# Patient Record
Sex: Female | Born: 1980 | Race: Black or African American | Hispanic: No | Marital: Single | Smoking: Current every day smoker
Health system: Southern US, Community
[De-identification: ages and names within clinical notes are randomized; demographics above are authoritative.]

## PROBLEM LIST (undated history)

## (undated) DIAGNOSIS — I10 Essential (primary) hypertension: Secondary | ICD-10-CM

## (undated) DIAGNOSIS — T7840XA Allergy, unspecified, initial encounter: Secondary | ICD-10-CM

## (undated) DIAGNOSIS — G600 Hereditary motor and sensory neuropathy: Secondary | ICD-10-CM

## (undated) HISTORY — DX: Allergy, unspecified, initial encounter: T78.40XA

## (undated) HISTORY — PX: OTHER SURGICAL HISTORY: SHX169

## (undated) HISTORY — DX: Hereditary motor and sensory neuropathy: G60.0

## (undated) HISTORY — PX: OVARIAN CYST REMOVAL: SHX89

---

## 2013-03-06 HISTORY — PX: MR LOWER LEG LEFT (ARMC HX): HXRAD1784

## 2014-03-10 ENCOUNTER — Encounter: Payer: Self-pay | Admitting: Neurology

## 2014-04-06 ENCOUNTER — Encounter: Payer: Self-pay | Admitting: Neurology

## 2014-05-05 ENCOUNTER — Encounter
Admit: 2014-05-05 | Disposition: A | Discharge status: Home / Self Care | Payer: Self-pay | Attending: Neurology | Admitting: Neurology

## 2014-05-18 ENCOUNTER — Emergency Department: Payer: Self-pay | Admitting: Emergency Medicine

## 2014-06-05 ENCOUNTER — Encounter
Admit: 2014-06-05 | Disposition: A | Discharge status: Home / Self Care | Payer: Self-pay | Attending: Neurology | Admitting: Neurology

## 2015-05-07 ENCOUNTER — Encounter: Payer: Self-pay | Admitting: Emergency Medicine

## 2015-05-07 ENCOUNTER — Emergency Department
Admission: EM | Admit: 2015-05-07 | Discharge: 2015-05-07 | Disposition: A | Discharge status: Home / Self Care | Payer: Medicare Other | Attending: Emergency Medicine | Admitting: Emergency Medicine

## 2015-05-07 DIAGNOSIS — Y9389 Activity, other specified: Secondary | ICD-10-CM | POA: Diagnosis not present

## 2015-05-07 DIAGNOSIS — S0502XA Injury of conjunctiva and corneal abrasion without foreign body, left eye, initial encounter: Secondary | ICD-10-CM | POA: Insufficient documentation

## 2015-05-07 DIAGNOSIS — F17200 Nicotine dependence, unspecified, uncomplicated: Secondary | ICD-10-CM | POA: Diagnosis not present

## 2015-05-07 DIAGNOSIS — I10 Essential (primary) hypertension: Secondary | ICD-10-CM | POA: Insufficient documentation

## 2015-05-07 DIAGNOSIS — S0592XA Unspecified injury of left eye and orbit, initial encounter: Secondary | ICD-10-CM | POA: Diagnosis present

## 2015-05-07 DIAGNOSIS — Y998 Other external cause status: Secondary | ICD-10-CM | POA: Diagnosis not present

## 2015-05-07 DIAGNOSIS — Y9289 Other specified places as the place of occurrence of the external cause: Secondary | ICD-10-CM | POA: Insufficient documentation

## 2015-05-07 DIAGNOSIS — X58XXXA Exposure to other specified factors, initial encounter: Secondary | ICD-10-CM | POA: Insufficient documentation

## 2015-05-07 HISTORY — DX: Essential (primary) hypertension: I10

## 2015-05-07 HISTORY — DX: Hereditary motor and sensory neuropathy: G60.0

## 2015-05-07 LAB — GLUCOSE, CAPILLARY: Glucose-Capillary: 118 mg/dL — ABNORMAL HIGH (ref 65–99)

## 2015-05-07 MED ORDER — ERYTHROMYCIN 5 MG/GM OP OINT: 1.0000 "application " | TOPICAL_OINTMENT | Freq: Three times a day (TID) | OPHTHALMIC | Status: DC

## 2015-05-07 MED ORDER — EYE WASH OPHTH SOLN
OPHTHALMIC | Status: AC
  Filled 2015-05-07: qty 118

## 2015-05-07 MED ORDER — FLUORESCEIN SODIUM 1 MG OP STRP
ORAL_STRIP | OPHTHALMIC | Status: AC
  Filled 2015-05-07: qty 1

## 2015-05-07 MED ORDER — TETRACAINE HCL 0.5 % OP SOLN
OPHTHALMIC | Status: AC
  Filled 2015-05-07: qty 2

## 2015-05-07 NOTE — ED Notes (Addendum)
Pt reports blurred vision at 1430 this afternoon. Pt denies dizziness, CP, SHOB, headache. Pt ambulatory to triage with no difficulty. Pt reports pain to left eye when opened.

## 2015-05-07 NOTE — ED Notes (Addendum)
Visual Acuity:  20/70 Left, 20/40 Right

## 2015-05-07 NOTE — ED Provider Notes (Signed)
[facility] Emergency Department Provider Note  ____________________________________________  Time seen: Approximately 5:03 PM  I have reviewed the triage vital signs and the nursing notes.   HISTORY  Chief Complaint Blurred Vision    HPI Kristine Campbell is a 35 y.o. female presents for evaluation of blurred vision at approximately 3 hours ago. 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 She also complains of pain to her left thigh worse than her right. Denies any known trauma or foreign body sensation. Visual acuities noted. Is not taking any medication over-the-counter and has significant allergies.   Past Medical History  Diagnosis Date  . Hypertension   . CMTD (Charcot-Marie-Tooth disease)     There are no active problems to display for this patient.   History reviewed. No pertinent past surgical history.  Current Outpatient Rx  Name  Route  Sig  Dispense  Refill  . erythromycin ophthalmic ointment   Left Eye   Place 1 application into the left eye 3 (three) times daily.   3.5 g   0     Allergies Other  No family history on file.  Social History Social History  Substance Use Topics  . Smoking status: Current Every Day Smoker  . Smokeless tobacco: None  . Alcohol Use: No    Review of Systems Constitutional: No fever/chills Eyes: Positive visual changes, left worse than right. ENT: No sore throat. Cardiovascular: Denies chest pain. Musculoskeletal: Negative for back pain. Skin: Negative for rash. Neurological: Negative for headaches, focal weakness or numbness.  10-point ROS otherwise negative.  ____________________________________________   PHYSICAL EXAM:  VITAL SIGNS: ED Triage Vitals  Enc Vitals Group     BP 05/07/15 1628 129/92 mmHg     Pulse Rate 05/07/15 1628 115     Resp 05/07/15 1628 16     Temp 05/07/15 1628 98.8 F (37.1 C)     Temp Source  05/07/15 1628 Oral     SpO2 05/07/15 1628 100 %     Weight 05/07/15 1628 156 lb (70.761 kg)     Height 05/07/15 1628 5\' 8"  (1.727 m)     Head Cir --      Peak Flow --      Pain Score 05/07/15 1628 8     Pain Loc --      Pain Edu? --      Excl. in GC? --     Constitutional: Alert and oriented. Well appearing and in no acute distress. Eyes: Right eye unremarkable left eye with erythematous conjunctiva. Head: Atraumatic. Nose: No congestion/rhinnorhea. Mouth/Throat: Mucous membranes are moist.  Oropharynx non-erythematous. Neck: No stridor.   Cardiovascular: Normal rate, regular rhythm. Grossly normal heart sounds.  Good peripheral circulation. Respiratory: Normal respiratory effort.  No retractions. Lungs CTAB. Neurologic:  Normal speech and language. No gross focal neurologic deficits are appreciated. No gait instability. Skin:  Skin is warm, dry and intact. No rash noted. Psychiatric: Mood and affect are normal. Speech and behavior are normal.  ____________________________________________   LABS (all labs ordered are listed, but only abnormal results are displayed)  Labs Reviewed  CBG MONITORING, ED      PROCEDURES  Procedure(s) performed:Yes  Discussed all clinical findings with the patient and discussed all allergies. Tetracaine 0.5% ophthalmic solution 2 drops placed in left eye. Fluorescein strip applied. Visualized left cornea with Woods lamp. Semilunate or corneal abrasion noted covering the superior medial aspect of the pupil. Approximately  15% of the eye. Irrigated left eye with 120 cc of eyewash irrigating solution.  Patient tolerated the procedure well.  INITIAL IMPRESSION / ASSESSMENT AND PLAN / ED COURSE  Pertinent labs & imaging results that were available during my care of the patient were reviewed by me and considered in my medical decision making (see chart for details).  Acute corneal abrasion to left eye. Rx given for erythromycin ointment 3 times a  day and encouraged use Tylenol over-the-counter for pain control. Patient to return to the ER 24 hours for eye recheck. She voices no other emergency medical complaints at this time. ____________________________________________   FINAL CLINICAL IMPRESSION(S) / ED DIAGNOSES  Final diagnoses:  Corneal abrasion, left, initial encounter     This chart was dictated using voice recognition software/Dragon. Despite best efforts to proofread, errors can occur which can change the meaning. Any change was purely unintentional.   Evangeline Dakin, PA-C 05/07/15 1738  Minna Antis, MD 05/07/15 [phone]

## 2015-05-07 NOTE — Discharge Instructions (Signed)

## 2015-05-08 ENCOUNTER — Emergency Department
Admission: EM | Admit: 2015-05-08 | Discharge: 2015-05-08 | Disposition: A | Discharge status: Home / Self Care | Payer: Medicare Other | Attending: Student | Admitting: Student

## 2015-05-08 ENCOUNTER — Encounter: Payer: Self-pay | Admitting: Emergency Medicine

## 2015-05-08 DIAGNOSIS — S0502XA Injury of conjunctiva and corneal abrasion without foreign body, left eye, initial encounter: Secondary | ICD-10-CM

## 2015-05-08 DIAGNOSIS — I10 Essential (primary) hypertension: Secondary | ICD-10-CM | POA: Insufficient documentation

## 2015-05-08 DIAGNOSIS — Y9289 Other specified places as the place of occurrence of the external cause: Secondary | ICD-10-CM | POA: Diagnosis not present

## 2015-05-08 DIAGNOSIS — Z4801 Encounter for change or removal of surgical wound dressing: Secondary | ICD-10-CM | POA: Diagnosis present

## 2015-05-08 DIAGNOSIS — Z792 Long term (current) use of antibiotics: Secondary | ICD-10-CM | POA: Diagnosis not present

## 2015-05-08 DIAGNOSIS — Y9389 Activity, other specified: Secondary | ICD-10-CM | POA: Insufficient documentation

## 2015-05-08 DIAGNOSIS — Y998 Other external cause status: Secondary | ICD-10-CM | POA: Diagnosis not present

## 2015-05-08 DIAGNOSIS — X58XXXA Exposure to other specified factors, initial encounter: Secondary | ICD-10-CM | POA: Diagnosis not present

## 2015-05-08 DIAGNOSIS — F17200 Nicotine dependence, unspecified, uncomplicated: Secondary | ICD-10-CM | POA: Insufficient documentation

## 2015-05-08 MED ORDER — FLUORESCEIN SODIUM 1 MG OP STRP
1.0000 | ORAL_STRIP | Freq: Once | OPHTHALMIC | Status: AC
  Administered 2015-05-08: 1 via OPHTHALMIC

## 2015-05-08 MED ORDER — FLUORESCEIN SODIUM 1 MG OP STRP
ORAL_STRIP | OPHTHALMIC | Status: AC
Start: 2015-05-08 — End: 2015-05-08
  Administered 2015-05-08: 1 via OPHTHALMIC
  Filled 2015-05-08: qty 1

## 2015-05-08 MED ORDER — ONDANSETRON HCL 4 MG/2ML IJ SOLN: 4.0000 mg | Freq: Once | INTRAMUSCULAR | Status: DC

## 2015-05-08 MED ORDER — HYDROMORPHONE HCL 1 MG/ML IJ SOLN: 1.0000 mg | Freq: Once | INTRAMUSCULAR | Status: DC

## 2015-05-08 MED ORDER — EYE WASH OPHTH SOLN
OPHTHALMIC | Status: AC
  Administered 2015-05-08: 1 [drp] via OPHTHALMIC
  Filled 2015-05-08: qty 118

## 2015-05-08 MED ORDER — EYE WASH OPHTH SOLN
1.0000 [drp] | OPHTHALMIC | Status: DC | PRN
  Administered 2015-05-08: 1 [drp] via OPHTHALMIC

## 2015-05-08 NOTE — ED Notes (Signed)
Visual acuity 20/70 bil.

## 2015-05-08 NOTE — Discharge Instructions (Signed)

## 2015-05-08 NOTE — ED Notes (Signed)
Discussed discharge instructions and follow-up care with patient. No questions or concerns at this time. Pt stable at discharge.  

## 2015-05-08 NOTE — ED Notes (Signed)
States was here for corneal abrasion yesterday, states instructed to come today for recheck.

## 2015-05-08 NOTE — ED Provider Notes (Signed)
[facility] Emergency Department Provider Note  ____________________________________________  Time seen: Approximately 9:25 AM  I have reviewed the triage vital signs and the nursing notes.   HISTORY  Chief Complaint Wound Check    HPI Kristine Campbell is a 35 y.o. female who presents for follow-up evaluation of corneal abrasion to the left eye previously. Patient states that she did not get her antibiotic but her pain feels better today. Patient's complains that she still can't see out of her left eye.    Past Medical History  Diagnosis Date  . Hypertension   . CMTD (Charcot-Marie-Tooth disease)     There are no active problems to display for this patient.   History reviewed. No pertinent past surgical history.  Current Outpatient Rx  Name  Route  Sig  Dispense  Refill  . erythromycin ophthalmic ointment   Left Eye   Place 1 application into the left eye 3 (three) times daily.   3.5 g   0     Allergies Other  No family history on file.  Social History Social History  Substance Use Topics  . Smoking status: Current Every Day Smoker  . Smokeless tobacco: None  . Alcohol Use: No    Review of Systems Constitutional: No fever/chills Eyes: Positive visual changes with erythematous to the conjunctiva Neurological: Negative for headaches, focal weakness or numbness.  10-point ROS otherwise negative.  ____________________________________________   PHYSICAL EXAM:  VITAL SIGNS: ED Triage Vitals  Enc Vitals Group     BP 05/08/15 0919 115/81 mmHg     Pulse Rate 05/08/15 0919 100     Resp 05/08/15 0919 18     Temp 05/08/15 0919 98.2 F (36.8 C)     Temp Source 05/08/15 0919 Oral     SpO2 05/08/15 0919 99 %     Weight 05/08/15 0919 156 lb (70.761 kg)     Height 05/08/15 0919 5\' 8"  (1.727 m)     Head Cir --      Peak Flow --      Pain Score 05/08/15 0922 5     Pain Loc --      Pain Edu? --      Excl. in GC? --      Constitutional: Alert and oriented. Well appearing and in no acute distress. Eyes: Left eye with conjunctival erythema and positive corneal abrasion noted over the pupil extending approximately 30% of the eye. Visual acuities noted same as yesterday at 70 over 70. Neurologic:  Normal speech and language. No gross focal neurologic deficits are appreciated. No gait instability. Skin:  Skin is warm, dry and intact. No rash noted. Psychiatric: Mood and affect are normal. Speech and behavior are normal.  ____________________________________________   LABS (all labs ordered are listed, but only abnormal results are displayed)  Labs Reviewed - No data to display   PROCEDURES  Procedure(s) performed: None  Critical Care performed: No  ____________________________________________   INITIAL IMPRESSION / ASSESSMENT AND PLAN / ED COURSE  Pertinent labs & imaging results that were available during my care of the patient were reviewed by me and considered in my medical decision making (see chart for details).  Corneal abrasion left eye continuous. Patient states that she will get her antibiotic ointment filled today and will follow-up in ED in 48 hours if needed [place]. She reports that any sudden onset or visual loss she will go to the nearest  emergency room otherwise needs follow-up with ophthalmology. ____________________________________________   FINAL CLINICAL IMPRESSION(S) / ED DIAGNOSES  Final diagnoses:  Corneal abrasion, left, initial encounter     This chart was dictated using voice recognition software/Dragon. Despite best efforts to proofread, errors can occur which can change the meaning. Any change was purely unintentional.   Evangeline Dakin, PA-C 05/08/15 1024  Charmayne Sheer Beers, PA-C 05/08/15 1024  Gayla Doss, MD 05/08/15 [phone]

## 2016-02-17 ENCOUNTER — Emergency Department
Admission: EM | Admit: 2016-02-17 | Discharge: 2016-02-17 | Disposition: A | Discharge status: Home / Self Care | Payer: Medicare Other | Attending: Emergency Medicine | Admitting: Emergency Medicine

## 2016-02-17 ENCOUNTER — Encounter: Payer: Self-pay | Admitting: Emergency Medicine

## 2016-02-17 DIAGNOSIS — R102 Pelvic and perineal pain: Secondary | ICD-10-CM

## 2016-02-17 DIAGNOSIS — R1030 Lower abdominal pain, unspecified: Secondary | ICD-10-CM | POA: Diagnosis not present

## 2016-02-17 DIAGNOSIS — F17290 Nicotine dependence, other tobacco product, uncomplicated: Secondary | ICD-10-CM | POA: Insufficient documentation

## 2016-02-17 DIAGNOSIS — I10 Essential (primary) hypertension: Secondary | ICD-10-CM | POA: Insufficient documentation

## 2016-02-17 LAB — COMPREHENSIVE METABOLIC PANEL
ALBUMIN: 4 g/dL (ref 3.5–5.0)
ALK PHOS: 83 U/L (ref 38–126)
ALT: 18 U/L (ref 14–54)
ANION GAP: 9 (ref 5–15)
AST: 31 U/L (ref 15–41)
BILIRUBIN TOTAL: 0.4 mg/dL (ref 0.3–1.2)
BUN: 8 mg/dL (ref 6–20)
CALCIUM: 9.3 mg/dL (ref 8.9–10.3)
CO2: 25 mmol/L (ref 22–32)
Chloride: 102 mmol/L (ref 101–111)
Creatinine, Ser: 0.39 mg/dL — ABNORMAL LOW (ref 0.44–1.00)
GFR calc Af Amer: >60 mL/min (ref >60)
GFR calc non Af Amer: >60 mL/min (ref >60)
GLUCOSE: 107 mg/dL — AB (ref 65–99)
Potassium: 3.7 mmol/L (ref 3.5–5.1)
SODIUM: 136 mmol/L (ref 135–145)
TOTAL PROTEIN: 8.2 g/dL — AB (ref 6.5–8.1)

## 2016-02-17 LAB — CBC
HEMATOCRIT: 39.4 % (ref 35.0–47.0)
HEMOGLOBIN: 13.5 g/dL (ref 12.0–16.0)
MCH: 27.8 pg (ref 26.0–34.0)
MCHC: 34.2 g/dL (ref 32.0–36.0)
MCV: 81.4 fL (ref 80.0–100.0)
Platelets: 267 10*3/uL (ref 150–440)
RBC: 4.85 MIL/uL (ref 3.80–5.20)
RDW: 16.5 % — AB (ref 11.5–14.5)
WBC: 10.2 10*3/uL (ref 3.6–11.0)

## 2016-02-17 LAB — URINALYSIS, COMPLETE (UACMP) WITH MICROSCOPIC
BACTERIA UA: NONE SEEN
Bilirubin Urine: NEGATIVE
Glucose, UA: NEGATIVE mg/dL
Ketones, ur: NEGATIVE mg/dL
Leukocytes, UA: NEGATIVE
NITRITE: NEGATIVE
PH: 6 (ref 5.0–8.0)
Protein, ur: NEGATIVE mg/dL
SPECIFIC GRAVITY, URINE: 1.006 (ref 1.005–1.030)

## 2016-02-17 LAB — POCT PREGNANCY, URINE: PREG TEST UR: NEGATIVE

## 2016-02-17 LAB — LIPASE, BLOOD: Lipase: 21 U/L (ref 11–51)

## 2016-02-17 NOTE — ED Provider Notes (Signed)
[facility] Emergency Department Provider Note  ____________________________________________  Time seen: Approximately 9:09 PM  I have reviewed the triage vital signs and the nursing notes.   HISTORY  Chief Complaint Abdominal Pain    HPI Kristine Campbell is a 35 y.o. female , nonpregnant, presenting with suprapubic discomfort. The patient reports that for the past 4 days she has had her period, and she has had an unusual sensation in the suprapubic area. It's a very mild pain, and she is not able to describe it any further. She does not have any associated change in vaginal discharge, nausea or vomiting, diarrhea, fever or chills. Her last bowel movement was this morning and it was normal. She is not having any dysuria.   Past Medical History:  Diagnosis Date  . CMTD (Charcot-Marie-Tooth disease)   . Hypertension     There are no active problems to display for this patient.   History reviewed. No pertinent surgical history.  Current Outpatient Rx  . Order #: [id number] Class: Print    Allergies Other  History reviewed. No pertinent family history.  Social History Social History  Substance Use Topics  . Smoking status: Current Every Day Smoker    Types: Cigars  . Smokeless tobacco: Never Used  . Alcohol use No    Review of Systems Constitutional: No fever/chills.No lightheadedness or syncope. Eyes: No visual changes. ENT: No sore throat. No congestion or rhinorrhea. Cardiovascular: Denies chest pain. Denies palpitations. Respiratory: Denies shortness of breath.  No cough. Gastrointestinal: Positive suprapubic discomfort.  No nausea, no vomiting.  No diarrhea.  No constipation. Genitourinary: Negative for dysuria. No change in vaginal discharge. Ongoing menses. Musculoskeletal: Negative for back pain. Skin: Negative for rash. Neurological: Negative for headaches. No focal numbness, tingling or weakness.   10-point ROS otherwise  negative.  ____________________________________________   PHYSICAL EXAM:  VITAL SIGNS: ED Triage Vitals  Enc Vitals Group     BP 02/17/16 1716 124/84     Pulse Rate 02/17/16 1716 (!) 113     Resp 02/17/16 1716 16     Temp 02/17/16 1716 99.5 F (37.5 C)     Temp Source 02/17/16 1716 Oral     SpO2 02/17/16 1716 99 %     Weight 02/17/16 1717 156 lb (70.8 kg)     Height 02/17/16 1717 5\' 8"  (1.727 m)     Head Circumference --      Peak Flow --      Pain Score 02/17/16 1721 3     Pain Loc --      Pain Edu? --      Excl. in GC? --     Constitutional: Alert and oriented. Well appearing and in no acute distress. Answers questions appropriately. Eyes: Conjunctivae are normal.  EOMI. No scleral icterus. Head: Atraumatic. Nose: No congestion/rhinnorhea. Mouth/Throat: Mucous membranes are moist.  Neck: No stridor.  Supple.   Cardiovascular: Normal rate, regular rhythm. No murmurs, rubs or gallops.  Respiratory: Normal respiratory effort.  No accessory muscle use or retractions. Lungs CTAB.  No wheezes, rales or ronchi. Gastrointestinal: Soft, and nondistended. Minimal discomfort with palpation in the suprapubic midline. No guarding or rebound.  No peritoneal signs. Musculoskeletal: No LE edema. Neurologic:  A&Ox3.  Speech is clear.  Face and smile are symmetric.  EOMI.  Moves all extremities well. Skin:  Skin is warm, dry and intact. No rash noted. Psychiatric: Mood and affect are normal. Speech and behavior are normal.  Normal judgement.  ____________________________________________  LABS (all labs ordered are listed, but only abnormal results are displayed)  Labs Reviewed  COMPREHENSIVE METABOLIC PANEL - Abnormal; Notable for the following:       Result Value   Glucose, Bld 107 (*)    Creatinine, Ser 0.39 (*)    Total Protein 8.2 (*)    All other components within normal limits  CBC - Abnormal; Notable for the following:    RDW 16.5 (*)    All other components within  normal limits  URINALYSIS, COMPLETE (UACMP) WITH MICROSCOPIC - Abnormal; Notable for the following:    Color, Urine STRAW (*)    APPearance CLEAR (*)    Hgb urine dipstick MODERATE (*)    Squamous Epithelial / LPF 0-5 (*)    All other components within normal limits  LIPASE, BLOOD  POC URINE PREG, ED  POCT PREGNANCY, URINE   ____________________________________________  EKG  Not indicated ____________________________________________  RADIOLOGY  No results found.  ____________________________________________   PROCEDURES  Procedure(s) performed: None  Procedures  Critical Care performed: No ____________________________________________   INITIAL IMPRESSION / ASSESSMENT AND PLAN / ED COURSE  Pertinent labs & imaging results that were available during my care of the patient were reviewed by me and considered in my medical decision making (see chart for details).  35 y.o. female presenting with suprapubic discomfort in the setting of menstruation. Overall, the patient has reassuring vital signs and has normal heart rate on my examination. She is afebrile, and her laboratory studies show normal white blood cell count, normal lites, and a urine that does not show UTI. Is possible that the patient has pain from her menstruation, or she may have fibroids. At this time, her workup does not indicate further imaging but I have spoken to her about following up with her primary care physician for ongoing pain and possible ultrasound versus CT scan if her pain continues. Change since return precautions as well as follow-up instructions.  ____________________________________________  FINAL CLINICAL IMPRESSION(S) / ED DIAGNOSES  Final diagnoses:  Suprapubic pain, acute    Clinical Course       NEW MEDICATIONS STARTED DURING THIS VISIT:  New Prescriptions   No medications on file      Rockne MenghiniAnne-Caroline Skie Vitrano, MD 02/17/16 2112

## 2016-02-17 NOTE — Discharge Instructions (Signed)
History and plenty of fluid to stay well-hydrated. You may try a heating pad for 10 minutes every 2 hours to decrease pain. Please return to the emergency department if your pain worsens, or if you develop fever, nausea or vomiting, diarrhea, or any other symptoms concerning to you.

## 2016-02-17 NOTE — ED Triage Notes (Signed)
Pt to ED c/o LLQ pain x3 days, denies n/v/d.  States feels tight, states took home pregnancy test and was negative.  Pt A&O, skin warm and dry.

## 2016-03-02 ENCOUNTER — Other Ambulatory Visit: Payer: Self-pay | Admitting: Family Medicine

## 2016-03-02 DIAGNOSIS — R1900 Intra-abdominal and pelvic swelling, mass and lump, unspecified site: Secondary | ICD-10-CM

## 2016-03-07 ENCOUNTER — Ambulatory Visit
Admission: RE | Admit: 2016-03-07 | Discharge: 2016-03-07 | Disposition: A | Discharge status: Home / Self Care | Payer: Medicare Other | Source: Ambulatory Visit | Attending: Family Medicine | Admitting: Family Medicine

## 2016-03-07 DIAGNOSIS — R1900 Intra-abdominal and pelvic swelling, mass and lump, unspecified site: Secondary | ICD-10-CM | POA: Diagnosis not present

## 2016-03-07 DIAGNOSIS — R109 Unspecified abdominal pain: Secondary | ICD-10-CM | POA: Insufficient documentation

## 2016-03-07 DIAGNOSIS — N83201 Unspecified ovarian cyst, right side: Secondary | ICD-10-CM | POA: Insufficient documentation

## 2016-11-09 ENCOUNTER — Emergency Department: Payer: Medicare Other

## 2016-11-09 ENCOUNTER — Emergency Department
Admission: EM | Admit: 2016-11-09 | Discharge: 2016-11-10 | Disposition: A | Discharge status: Home / Self Care | Payer: Medicare Other | Attending: Emergency Medicine | Admitting: Emergency Medicine

## 2016-11-09 ENCOUNTER — Encounter: Payer: Self-pay | Admitting: Emergency Medicine

## 2016-11-09 DIAGNOSIS — M25561 Pain in right knee: Secondary | ICD-10-CM | POA: Insufficient documentation

## 2016-11-09 DIAGNOSIS — I10 Essential (primary) hypertension: Secondary | ICD-10-CM | POA: Diagnosis not present

## 2016-11-09 DIAGNOSIS — F17210 Nicotine dependence, cigarettes, uncomplicated: Secondary | ICD-10-CM | POA: Diagnosis not present

## 2016-11-09 NOTE — ED Triage Notes (Signed)
Pt to triage via w/c with no distress noted, reports "didn't have shoe on right" and fell twisting right knee; c/o pain since

## 2016-11-09 NOTE — ED Notes (Signed)
Pt returned from xray via wheelchair

## 2016-11-10 NOTE — ED Notes (Signed)

## 2016-11-10 NOTE — ED Provider Notes (Signed)
[facility]-EMERGENCY DEPARTMENT Provider Note   CSN: [id number] Arrival date & time: 11/09/16  2152     History   Chief Complaint Chief Complaint  Patient presents with  . Knee Pain    HPI Kristine Campbell is a 36 y.o. female presents to the emergent department for evaluation of right knee pain. Patient states just prior to arrival she was putting on a shoe, stumbled, twisted and felt pain in her right knee. She developed some pain and swelling. Pain is moderate. She has not taken any medications for pain. Pain is improved with lying down. Pain is increased with flexing the knee and weightbearing activity. She has been able to ambulate but with at length. She denies any injury to her body. No groin, back or ankle pain.  HPI  Past Medical History:  Diagnosis Date  . CMTD (Charcot-Marie-Tooth disease)   . Hypertension     There are no active problems to display for this patient.   History reviewed. No pertinent surgical history.  OB History    No data available       Home Medications    Prior to Admission medications   Medication Sig Start Date End Date Taking? Authorizing Provider  erythromycin ophthalmic ointment Place 1 application into the left eye 3 (three) times daily. 05/07/15   Beers, Charmayne Sheer, PA-C    Family History No family history on file.  Social History Social History  Substance Use Topics  . Smoking status: Current Every Day Smoker    Types: Cigars  . Smokeless tobacco: Never Used  . Alcohol use No     Allergies   Other   Review of Systems Review of Systems  Constitutional: Negative for fever.  Respiratory: Negative for shortness of breath.   Cardiovascular: Negative for chest pain.  Musculoskeletal: Positive for arthralgias and joint swelling. Negative for back pain, gait problem, myalgias, neck pain and neck stiffness.  Skin: Negative for wound.  Neurological: Negative for dizziness, weakness, light-headedness and headaches.      Physical Exam Updated Vital Signs BP 119/85 (BP Location: Left Arm)   Pulse (!) 105   Temp 98.7 F (37.1 C) (Oral)   Resp 18   Ht  (1.727 m)   Wt 69.9 kg (154 lb)   LMP 10/23/2016   SpO2 100%   BMI 23.42 kg/m   Physical Exam  Constitutional: She is oriented to person, place, and time. She appears well-developed and well-nourished.  HENT:  Head: Normocephalic and atraumatic.  Neck: Normal range of motion.  Pulmonary/Chest: Effort normal. No respiratory distress.  Musculoskeletal:  Examination of the right knee shows patient has mild swelling, no effusion mild tenderness on the medial lateral joint line. She is able to straight leg raise area shows good internal and external rotation of the right hip with no discomfort. She has range of motion to 100 with moderate discomfort. She is stable to valgus and varus stress testing. Positive McMurray's sign. Negative anterior drawer test.  Neurological: She is alert and oriented to person, place, and time.     ED Treatments / Results  Labs (all labs ordered are listed, but only abnormal results are displayed) Labs Reviewed - No data to display  EKG  EKG Interpretation None       Radiology Dg Knee Complete 4 Views Right  Result Date: 11/09/2016 CLINICAL DATA:  Fall with twisting injury to the right knee. Right knee pain. EXAM: RIGHT KNEE - COMPLETE 4+ VIEW COMPARISON:  None. FINDINGS: No  evidence of fracture, dislocation, or joint effusion. No evidence of arthropathy or other focal bone abnormality. Soft tissues are unremarkable. IMPRESSION: Negative. Electronically Signed   By: Burman NievesWilliam  Stevens M.D.   On: 11/09/2016 22:54    Procedures Procedures (including critical care time) SPLINT APPLICATION Date/Time: 12:41 AM Authorized by: Patience MuscaGAINES, Aidric Endicott CHRISTOPHER Consent: Verbal consent obtained. Risks and benefits: risks, benefits and alternatives were discussed Consent given by: patient Splint applied by: ED  tech Location details: Right knee  Splint type: Knee immobilizer right  Supplies used: Knee immobilizer, walker  Post-procedure: The splinted body part was neurovascularly unchanged following the procedure. Patient tolerance: Patient tolerated the procedure well with no immediate complications.     Medications Ordered in ED Medications - No data to display   Initial Impression / Assessment and Plan / ED Course  I have reviewed the triage vital signs and the nursing notes.  Pertinent labs & imaging results that were available during my care of the patient were reviewed by me and considered in my medical decision making (see chart for details).     36 year old female with right knee pain. Patient suffered a twisting injury to the right knee. X-ray show no evidence of acute bony abnormality. No injury to the extensor mechanism. No ligament laxity. No significant effusion. She is placed in knee immobilizer. She is given a walker to help with ambulation. She will rest ice and elevate the knee. Follow-up with orthopedics.  Final Clinical Impressions(s) / ED Diagnoses   Final diagnoses:  Acute pain of right knee    New Prescriptions New Prescriptions   No medications on file     Ronnette JuniperGaines, Chrishonda Hesch C, PA-C 11/10/16 0042    Nita SickleVeronese, [place], MD 11/10/16 [phone]

## 2016-11-10 NOTE — Discharge Instructions (Signed)
Please rest ice and elevate the right knee. Wear knee immobilizer as needed. Use walker as needed for ambulation. Call orthopedics tomorrow to schedule follow-up appointment.

## 2018-03-07 DIAGNOSIS — J309 Allergic rhinitis, unspecified: Secondary | ICD-10-CM | POA: Diagnosis not present

## 2018-03-07 DIAGNOSIS — R718 Other abnormality of red blood cells: Secondary | ICD-10-CM | POA: Diagnosis not present

## 2018-04-05 DIAGNOSIS — L700 Acne vulgaris: Secondary | ICD-10-CM | POA: Diagnosis not present

## 2018-04-05 DIAGNOSIS — L905 Scar conditions and fibrosis of skin: Secondary | ICD-10-CM | POA: Diagnosis not present

## 2018-04-05 DIAGNOSIS — L2089 Other atopic dermatitis: Secondary | ICD-10-CM | POA: Diagnosis not present

## 2018-04-12 ENCOUNTER — Emergency Department: Payer: Medicare HMO

## 2018-04-12 ENCOUNTER — Emergency Department
Admission: EM | Admit: 2018-04-12 | Discharge: 2018-04-12 | Disposition: A | Discharge status: Home / Self Care | Payer: Medicare HMO | Attending: Emergency Medicine | Admitting: Emergency Medicine

## 2018-04-12 ENCOUNTER — Other Ambulatory Visit: Payer: Self-pay

## 2018-04-12 DIAGNOSIS — W19XXXA Unspecified fall, initial encounter: Secondary | ICD-10-CM | POA: Insufficient documentation

## 2018-04-12 DIAGNOSIS — S92251A Displaced fracture of navicular [scaphoid] of right foot, initial encounter for closed fracture: Secondary | ICD-10-CM | POA: Insufficient documentation

## 2018-04-12 DIAGNOSIS — S92241A Displaced fracture of medial cuneiform of right foot, initial encounter for closed fracture: Secondary | ICD-10-CM | POA: Diagnosis not present

## 2018-04-12 DIAGNOSIS — Y999 Unspecified external cause status: Secondary | ICD-10-CM | POA: Insufficient documentation

## 2018-04-12 DIAGNOSIS — F17290 Nicotine dependence, other tobacco product, uncomplicated: Secondary | ICD-10-CM | POA: Insufficient documentation

## 2018-04-12 DIAGNOSIS — Y9379 Activity, other specified sports and athletics: Secondary | ICD-10-CM | POA: Insufficient documentation

## 2018-04-12 DIAGNOSIS — I10 Essential (primary) hypertension: Secondary | ICD-10-CM | POA: Insufficient documentation

## 2018-04-12 DIAGNOSIS — Y929 Unspecified place or not applicable: Secondary | ICD-10-CM | POA: Diagnosis not present

## 2018-04-12 DIAGNOSIS — S99921A Unspecified injury of right foot, initial encounter: Secondary | ICD-10-CM | POA: Diagnosis present

## 2018-04-12 MED ORDER — MELOXICAM 15 MG PO TABS: 15.0000 mg | ORAL_TABLET | Freq: Every day | ORAL | 0 refills | Status: DC

## 2018-04-12 MED ORDER — HYDROCODONE-ACETAMINOPHEN 5-325 MG PO TABS: 1.0000 | ORAL_TABLET | Freq: Four times a day (QID) | ORAL | 0 refills | Status: AC | PRN

## 2018-04-12 MED ORDER — NAPROXEN 500 MG PO TABS
500.0000 mg | ORAL_TABLET | Freq: Once | ORAL | Status: AC
  Administered 2018-04-12: 500 mg via ORAL
  Filled 2018-04-12: qty 1

## 2018-04-12 MED ORDER — HYDROCODONE-ACETAMINOPHEN 5-325 MG PO TABS
1.0000 | ORAL_TABLET | Freq: Once | ORAL | Status: AC
  Administered 2018-04-12: 1 via ORAL
  Filled 2018-04-12: qty 1

## 2018-04-12 NOTE — ED Notes (Signed)
Patient transported to X-ray 

## 2018-04-12 NOTE — ED Triage Notes (Signed)
Pt states she fell at 1300 today injuring right foot. Ice applied in triage. tp complains of ankle pain as well, no swelling or deformity noted.

## 2018-04-12 NOTE — ED Provider Notes (Signed)
[facility] Emergency Department Provider Note ____________________________________________  Time seen: Approximately 9:34 PM  I have reviewed the triage vital signs and the nursing notes.   HISTORY  Chief Complaint Foot Injury    HPI Jamesetta Orleansnnette Garfinkel is a 38 y.o. female who presents to the emergency department for evaluation and treatment of right foot pain.  Patient states that she was trying to do some new exercises and fell.  She states that she took half an oxycodone which has helped, but the foot has continued to swell.  Incident happened this afternoon.  No previous fractures or injuries to the foot that she can recall.  Past Medical History:  Diagnosis Date  . CMTD (Charcot-Marie-Tooth disease)   . Hypertension     There are no active problems to display for this patient.   No past surgical history on file.  Prior to Admission medications   Medication Sig Start Date End Date Taking? Authorizing Provider  erythromycin ophthalmic ointment Place 1 application into the left eye 3 (three) times daily. 05/07/15   Beers, Charmayne Sheerharles M, PA-C  HYDROcodone-acetaminophen (NORCO/VICODIN) 5-325 MG tablet Take 1 tablet by mouth every 6 (six) hours as needed for up to 3 days for severe pain. 04/12/18 04/15/18  Junko Ohagan, Rulon Eisenmengerari B, FNP  meloxicam (MOBIC) 15 MG tablet Take 1 tablet (15 mg total) by mouth daily. 04/12/18   Chinita Pesterriplett, Tityana Pagan B, FNP    Allergies Other  No family history on file.  Social History Social History   Tobacco Use  . Smoking status: Current Every Day Smoker    Types: Cigars  . Smokeless tobacco: Never Used  Substance Use Topics  . Alcohol use: No  . Drug use: No    Review of Systems Constitutional: Negative for fever. Cardiovascular: Negative for chest pain. Respiratory: Negative for shortness of breath. Musculoskeletal: Positive for right foot and ankle pain. Skin: Positive for swelling of the right foot Neurological: Negative for  decrease in sensation  ____________________________________________   PHYSICAL EXAM:  VITAL SIGNS: ED Triage Vitals  Enc Vitals Group     BP 04/12/18 2031 124/84     Pulse Rate 04/12/18 2031 98     Resp 04/12/18 2031 16     Temp 04/12/18 2031 98.2 F (36.8 C)     Temp Source 04/12/18 2031 Oral     SpO2 04/12/18 2031 100 %     Weight 04/12/18 2030 150 lb (68 kg)     Height 04/12/18 2030 5\' 8"  (1.727 m)     Head Circumference --      Peak Flow --      Pain Score 04/12/18 2030 10     Pain Loc --      Pain Edu? --      Excl. in GC? --     Constitutional: Alert and oriented. Well appearing and in no acute distress. Eyes: Conjunctivae are clear without discharge or drainage Head: Atraumatic Neck: Supple Respiratory: No cough. Respirations are even and unlabored. Musculoskeletal: Tenderness over the midfoot on palpation.  Full range of motion of the toes.  Full range of motion of the ankle. Neurologic: Motor and sensory function of the right foot is intact. Skin: No open wounds or lesions over the midfoot.  Psychiatric: Flat affect, behavior is appropriate.  ____________________________________________   LABS (all labs ordered are listed, but only abnormal results are displayed)  Labs Reviewed - No data to display ____________________________________________  RADIOLOGY  Right ankle is negative for acute bony abnormalities.  Right foot shows a fracture along the anterior aspect of the navicular bone with a small avulsed fragment off of the anterior aspect of the medial cuneiform with some mild joint space narrowing in the first MTP joint. ____________________________________________   PROCEDURES  .Splint Application Date/Time: 04/12/2018 10:04 PM Performed by: Chinita Pester, FNP Authorized by: Chinita Pester, FNP   Consent:    Consent obtained:  Verbal   Consent given by:  Patient   Risks discussed:  Pain Procedure details:    Laterality:  Right   Cast type:   Short leg   Supplies:  Cotton padding, elastic bandage and Ortho-Glass Post-procedure details:    Pain:  Unchanged   Sensation:  Normal   Patient tolerance of procedure:  Tolerated well, no immediate complications   ____________________________________________   INITIAL IMPRESSION / ASSESSMENT AND PLAN / ED COURSE  Nyeli Diosdado is a 38 y.o. who presents to the emergency department for treatment and evaluation after injuring her foot earlier this afternoon.  X-ray confirms fracture of the midfoot.  She will be placed in a posterior short leg OCL and was advised to limit weightbearing as much as possible.  She will be given crutches.  She is to call podiatry on Monday morning to schedule an appointment.  She was given a prescription for meloxicam and Norco for the next few days.  She was encouraged to return to the emergency department for symptoms of change or worsen if she is unable to see her primary care provider or the podiatrist.   Medications  HYDROcodone-acetaminophen (NORCO/VICODIN) 5-325 MG per tablet 1 tablet (1 tablet Oral Given 04/12/18 2153)  naproxen (NAPROSYN) tablet 500 mg (500 mg Oral Given 04/12/18 2153)    Pertinent labs & imaging results that were available during my care of the patient were reviewed by me and considered in my medical decision making (see chart for details).  _________________________________________   FINAL CLINICAL IMPRESSION(S) / ED DIAGNOSES  Final diagnoses:  Closed displaced fracture of navicular bone of right foot, initial encounter  Closed displaced fracture of medial cuneiform of right foot, initial encounter    ED Discharge Orders         Ordered    HYDROcodone-acetaminophen (NORCO/VICODIN) 5-325 MG tablet  Every 6 hours PRN     04/12/18 2200    meloxicam (MOBIC) 15 MG tablet  Daily     04/12/18 2200           If controlled substance prescribed during this visit, 12 month history viewed on the [facility] prior to issuing an  initial prescription for Schedule II or III opiod.    Chinita Pester, FNP 04/12/18 2206    Rockne Menghini, MD 04/12/18 [phone]

## 2018-04-12 NOTE — Discharge Instructions (Signed)
Please call and schedule follow-up appointment with podiatry.  Rest, ice, and elevate your foot.  Take the pain medication only as prescribed.  Return to the emergency department for symptoms of change or worsen if you are unable to schedule an appointment.

## 2018-04-18 DIAGNOSIS — S92254A Nondisplaced fracture of navicular [scaphoid] of right foot, initial encounter for closed fracture: Secondary | ICD-10-CM | POA: Diagnosis not present

## 2018-04-23 DIAGNOSIS — J3089 Other allergic rhinitis: Secondary | ICD-10-CM | POA: Diagnosis not present

## 2018-04-30 DIAGNOSIS — J3089 Other allergic rhinitis: Secondary | ICD-10-CM | POA: Diagnosis not present

## 2018-05-02 DIAGNOSIS — S92254D Nondisplaced fracture of navicular [scaphoid] of right foot, subsequent encounter for fracture with routine healing: Secondary | ICD-10-CM | POA: Diagnosis not present

## 2018-05-02 DIAGNOSIS — J3089 Other allergic rhinitis: Secondary | ICD-10-CM | POA: Diagnosis not present

## 2018-05-07 DIAGNOSIS — J3089 Other allergic rhinitis: Secondary | ICD-10-CM | POA: Diagnosis not present

## 2018-05-09 DIAGNOSIS — J3089 Other allergic rhinitis: Secondary | ICD-10-CM | POA: Diagnosis not present

## 2018-05-16 DIAGNOSIS — J3089 Other allergic rhinitis: Secondary | ICD-10-CM | POA: Diagnosis not present

## 2018-05-23 DIAGNOSIS — S92254D Nondisplaced fracture of navicular [scaphoid] of right foot, subsequent encounter for fracture with routine healing: Secondary | ICD-10-CM | POA: Diagnosis not present

## 2018-05-23 DIAGNOSIS — J3089 Other allergic rhinitis: Secondary | ICD-10-CM | POA: Diagnosis not present

## 2018-05-28 DIAGNOSIS — J3089 Other allergic rhinitis: Secondary | ICD-10-CM | POA: Diagnosis not present

## 2018-06-06 DIAGNOSIS — J3089 Other allergic rhinitis: Secondary | ICD-10-CM | POA: Diagnosis not present

## 2018-06-13 DIAGNOSIS — J301 Allergic rhinitis due to pollen: Secondary | ICD-10-CM | POA: Diagnosis not present

## 2018-06-13 DIAGNOSIS — J3089 Other allergic rhinitis: Secondary | ICD-10-CM | POA: Diagnosis not present

## 2018-06-20 DIAGNOSIS — R21 Rash and other nonspecific skin eruption: Secondary | ICD-10-CM | POA: Diagnosis not present

## 2018-06-20 DIAGNOSIS — F17200 Nicotine dependence, unspecified, uncomplicated: Secondary | ICD-10-CM | POA: Diagnosis not present

## 2018-06-20 DIAGNOSIS — J3089 Other allergic rhinitis: Secondary | ICD-10-CM | POA: Diagnosis not present

## 2018-06-20 DIAGNOSIS — H1045 Other chronic allergic conjunctivitis: Secondary | ICD-10-CM | POA: Diagnosis not present

## 2018-06-24 DIAGNOSIS — J3089 Other allergic rhinitis: Secondary | ICD-10-CM | POA: Diagnosis not present

## 2018-06-27 DIAGNOSIS — J3089 Other allergic rhinitis: Secondary | ICD-10-CM | POA: Diagnosis not present

## 2018-07-04 DIAGNOSIS — J3089 Other allergic rhinitis: Secondary | ICD-10-CM | POA: Diagnosis not present

## 2018-07-11 DIAGNOSIS — J3089 Other allergic rhinitis: Secondary | ICD-10-CM | POA: Diagnosis not present

## 2018-07-18 DIAGNOSIS — J3089 Other allergic rhinitis: Secondary | ICD-10-CM | POA: Diagnosis not present

## 2018-07-23 DIAGNOSIS — J3089 Other allergic rhinitis: Secondary | ICD-10-CM | POA: Diagnosis not present

## 2018-07-25 DIAGNOSIS — J3089 Other allergic rhinitis: Secondary | ICD-10-CM | POA: Diagnosis not present

## 2018-07-30 DIAGNOSIS — J3089 Other allergic rhinitis: Secondary | ICD-10-CM | POA: Diagnosis not present

## 2018-08-01 DIAGNOSIS — J3089 Other allergic rhinitis: Secondary | ICD-10-CM | POA: Diagnosis not present

## 2018-08-06 DIAGNOSIS — J3089 Other allergic rhinitis: Secondary | ICD-10-CM | POA: Diagnosis not present

## 2018-08-15 DIAGNOSIS — J3089 Other allergic rhinitis: Secondary | ICD-10-CM | POA: Diagnosis not present

## 2018-08-22 DIAGNOSIS — J3089 Other allergic rhinitis: Secondary | ICD-10-CM | POA: Diagnosis not present

## 2018-08-29 DIAGNOSIS — J3089 Other allergic rhinitis: Secondary | ICD-10-CM | POA: Diagnosis not present

## 2018-09-05 DIAGNOSIS — J3089 Other allergic rhinitis: Secondary | ICD-10-CM | POA: Diagnosis not present

## 2018-09-12 DIAGNOSIS — J3089 Other allergic rhinitis: Secondary | ICD-10-CM | POA: Diagnosis not present

## 2018-09-19 DIAGNOSIS — J3089 Other allergic rhinitis: Secondary | ICD-10-CM | POA: Diagnosis not present

## 2018-09-26 DIAGNOSIS — J3089 Other allergic rhinitis: Secondary | ICD-10-CM | POA: Diagnosis not present

## 2018-10-10 DIAGNOSIS — J3089 Other allergic rhinitis: Secondary | ICD-10-CM | POA: Diagnosis not present

## 2018-10-18 DIAGNOSIS — J3089 Other allergic rhinitis: Secondary | ICD-10-CM | POA: Diagnosis not present

## 2018-10-24 DIAGNOSIS — J3089 Other allergic rhinitis: Secondary | ICD-10-CM | POA: Diagnosis not present

## 2018-10-31 DIAGNOSIS — J3089 Other allergic rhinitis: Secondary | ICD-10-CM | POA: Diagnosis not present

## 2018-11-07 DIAGNOSIS — J3089 Other allergic rhinitis: Secondary | ICD-10-CM | POA: Diagnosis not present

## 2018-11-14 DIAGNOSIS — J3089 Other allergic rhinitis: Secondary | ICD-10-CM | POA: Diagnosis not present

## 2018-11-28 DIAGNOSIS — J3089 Other allergic rhinitis: Secondary | ICD-10-CM | POA: Diagnosis not present

## 2018-11-28 DIAGNOSIS — Z720 Tobacco use: Secondary | ICD-10-CM | POA: Diagnosis not present

## 2018-11-28 DIAGNOSIS — G600 Hereditary motor and sensory neuropathy: Secondary | ICD-10-CM | POA: Diagnosis not present

## 2018-11-29 DIAGNOSIS — J3089 Other allergic rhinitis: Secondary | ICD-10-CM | POA: Diagnosis not present

## 2018-12-02 DIAGNOSIS — R202 Paresthesia of skin: Secondary | ICD-10-CM | POA: Diagnosis not present

## 2018-12-02 DIAGNOSIS — R200 Anesthesia of skin: Secondary | ICD-10-CM | POA: Diagnosis not present

## 2018-12-02 DIAGNOSIS — J309 Allergic rhinitis, unspecified: Secondary | ICD-10-CM | POA: Diagnosis not present

## 2018-12-02 DIAGNOSIS — G600 Hereditary motor and sensory neuropathy: Secondary | ICD-10-CM | POA: Diagnosis not present

## 2018-12-02 DIAGNOSIS — Z0000 Encounter for general adult medical examination without abnormal findings: Secondary | ICD-10-CM | POA: Diagnosis not present

## 2018-12-02 DIAGNOSIS — M545 Low back pain: Secondary | ICD-10-CM | POA: Diagnosis not present

## 2018-12-02 DIAGNOSIS — L309 Dermatitis, unspecified: Secondary | ICD-10-CM | POA: Diagnosis not present

## 2018-12-02 DIAGNOSIS — F17210 Nicotine dependence, cigarettes, uncomplicated: Secondary | ICD-10-CM | POA: Diagnosis not present

## 2018-12-04 DIAGNOSIS — L209 Atopic dermatitis, unspecified: Secondary | ICD-10-CM | POA: Diagnosis not present

## 2018-12-05 DIAGNOSIS — J3089 Other allergic rhinitis: Secondary | ICD-10-CM | POA: Diagnosis not present

## 2018-12-09 DIAGNOSIS — F17210 Nicotine dependence, cigarettes, uncomplicated: Secondary | ICD-10-CM | POA: Diagnosis not present

## 2018-12-09 DIAGNOSIS — G600 Hereditary motor and sensory neuropathy: Secondary | ICD-10-CM | POA: Diagnosis not present

## 2018-12-09 DIAGNOSIS — L309 Dermatitis, unspecified: Secondary | ICD-10-CM | POA: Diagnosis not present

## 2018-12-09 DIAGNOSIS — R200 Anesthesia of skin: Secondary | ICD-10-CM | POA: Diagnosis not present

## 2018-12-09 DIAGNOSIS — Z0000 Encounter for general adult medical examination without abnormal findings: Secondary | ICD-10-CM | POA: Diagnosis not present

## 2018-12-09 DIAGNOSIS — M545 Low back pain: Secondary | ICD-10-CM | POA: Diagnosis not present

## 2018-12-09 DIAGNOSIS — J309 Allergic rhinitis, unspecified: Secondary | ICD-10-CM | POA: Diagnosis not present

## 2018-12-09 DIAGNOSIS — R202 Paresthesia of skin: Secondary | ICD-10-CM | POA: Diagnosis not present

## 2018-12-12 DIAGNOSIS — J3089 Other allergic rhinitis: Secondary | ICD-10-CM | POA: Diagnosis not present

## 2018-12-19 DIAGNOSIS — J3089 Other allergic rhinitis: Secondary | ICD-10-CM | POA: Diagnosis not present

## 2018-12-24 DIAGNOSIS — J3081 Allergic rhinitis due to animal (cat) (dog) hair and dander: Secondary | ICD-10-CM | POA: Diagnosis not present

## 2018-12-24 DIAGNOSIS — J301 Allergic rhinitis due to pollen: Secondary | ICD-10-CM | POA: Diagnosis not present

## 2018-12-24 DIAGNOSIS — J3089 Other allergic rhinitis: Secondary | ICD-10-CM | POA: Diagnosis not present

## 2018-12-26 DIAGNOSIS — J3089 Other allergic rhinitis: Secondary | ICD-10-CM | POA: Diagnosis not present

## 2019-01-02 DIAGNOSIS — J3089 Other allergic rhinitis: Secondary | ICD-10-CM | POA: Diagnosis not present

## 2019-01-07 DIAGNOSIS — J3089 Other allergic rhinitis: Secondary | ICD-10-CM | POA: Diagnosis not present

## 2019-01-21 DIAGNOSIS — J3089 Other allergic rhinitis: Secondary | ICD-10-CM | POA: Diagnosis not present

## 2019-01-27 DIAGNOSIS — G600 Hereditary motor and sensory neuropathy: Secondary | ICD-10-CM | POA: Diagnosis not present

## 2019-01-27 DIAGNOSIS — M549 Dorsalgia, unspecified: Secondary | ICD-10-CM | POA: Diagnosis not present

## 2019-02-06 DIAGNOSIS — J3089 Other allergic rhinitis: Secondary | ICD-10-CM | POA: Diagnosis not present

## 2019-02-18 DIAGNOSIS — J3089 Other allergic rhinitis: Secondary | ICD-10-CM | POA: Diagnosis not present

## 2019-02-25 DIAGNOSIS — J3089 Other allergic rhinitis: Secondary | ICD-10-CM | POA: Diagnosis not present

## 2019-03-06 DIAGNOSIS — J3089 Other allergic rhinitis: Secondary | ICD-10-CM | POA: Diagnosis not present

## 2019-03-13 DIAGNOSIS — J3089 Other allergic rhinitis: Secondary | ICD-10-CM | POA: Diagnosis not present

## 2019-03-20 DIAGNOSIS — J3089 Other allergic rhinitis: Secondary | ICD-10-CM | POA: Diagnosis not present

## 2019-03-27 DIAGNOSIS — J3089 Other allergic rhinitis: Secondary | ICD-10-CM | POA: Diagnosis not present

## 2019-04-10 DIAGNOSIS — J3089 Other allergic rhinitis: Secondary | ICD-10-CM | POA: Diagnosis not present

## 2019-04-17 DIAGNOSIS — J3089 Other allergic rhinitis: Secondary | ICD-10-CM | POA: Diagnosis not present

## 2019-04-18 DIAGNOSIS — J0100 Acute maxillary sinusitis, unspecified: Secondary | ICD-10-CM | POA: Diagnosis not present

## 2019-04-18 DIAGNOSIS — F17210 Nicotine dependence, cigarettes, uncomplicated: Secondary | ICD-10-CM | POA: Diagnosis not present

## 2019-04-18 DIAGNOSIS — J309 Allergic rhinitis, unspecified: Secondary | ICD-10-CM | POA: Diagnosis not present

## 2019-04-21 ENCOUNTER — Encounter: Payer: Self-pay | Admitting: Student in an Organized Health Care Education/Training Program

## 2019-04-21 ENCOUNTER — Telehealth: Payer: Self-pay

## 2019-04-21 NOTE — Progress Notes (Signed)
I attempted to call the patient however no response. Voicemail left instructing patient to call front desk office at [phone] to reschedule appointment. -Dr Adri Schloss  

## 2019-04-21 NOTE — Telephone Encounter (Signed)
No answer. Left message to call so that we could get history for tomorrows visit.

## 2019-04-22 ENCOUNTER — Ambulatory Visit
Payer: Medicare Other | Attending: Student in an Organized Health Care Education/Training Program | Admitting: Student in an Organized Health Care Education/Training Program

## 2019-04-22 ENCOUNTER — Other Ambulatory Visit: Payer: Self-pay

## 2019-04-22 VITALS — Ht 68.0 in | Wt 135.0 lb

## 2019-04-22 DIAGNOSIS — G894 Chronic pain syndrome: Secondary | ICD-10-CM

## 2019-05-01 DIAGNOSIS — J3089 Other allergic rhinitis: Secondary | ICD-10-CM | POA: Diagnosis not present

## 2019-05-06 DIAGNOSIS — M549 Dorsalgia, unspecified: Secondary | ICD-10-CM | POA: Diagnosis not present

## 2019-05-06 DIAGNOSIS — G600 Hereditary motor and sensory neuropathy: Secondary | ICD-10-CM | POA: Diagnosis not present

## 2019-05-06 DIAGNOSIS — Z720 Tobacco use: Secondary | ICD-10-CM | POA: Diagnosis not present

## 2019-05-06 DIAGNOSIS — R270 Ataxia, unspecified: Secondary | ICD-10-CM | POA: Diagnosis not present

## 2019-05-08 DIAGNOSIS — J3089 Other allergic rhinitis: Secondary | ICD-10-CM | POA: Diagnosis not present

## 2019-05-15 DIAGNOSIS — J3089 Other allergic rhinitis: Secondary | ICD-10-CM | POA: Diagnosis not present

## 2019-05-17 DIAGNOSIS — L209 Atopic dermatitis, unspecified: Secondary | ICD-10-CM | POA: Insufficient documentation

## 2019-05-22 DIAGNOSIS — J3089 Other allergic rhinitis: Secondary | ICD-10-CM | POA: Diagnosis not present

## 2019-05-29 DIAGNOSIS — J3089 Other allergic rhinitis: Secondary | ICD-10-CM | POA: Diagnosis not present

## 2019-05-29 IMAGING — CR DG ANKLE COMPLETE 3+V*R*
3 series · 3 of 3 positions shown · non-contrast
Comparison: Foot series performed today

CLINICAL DATA: Fall, foot and ankle pain

EXAM:
RIGHT ANKLE - COMPLETE 3+ VIEW

[ankle ap]
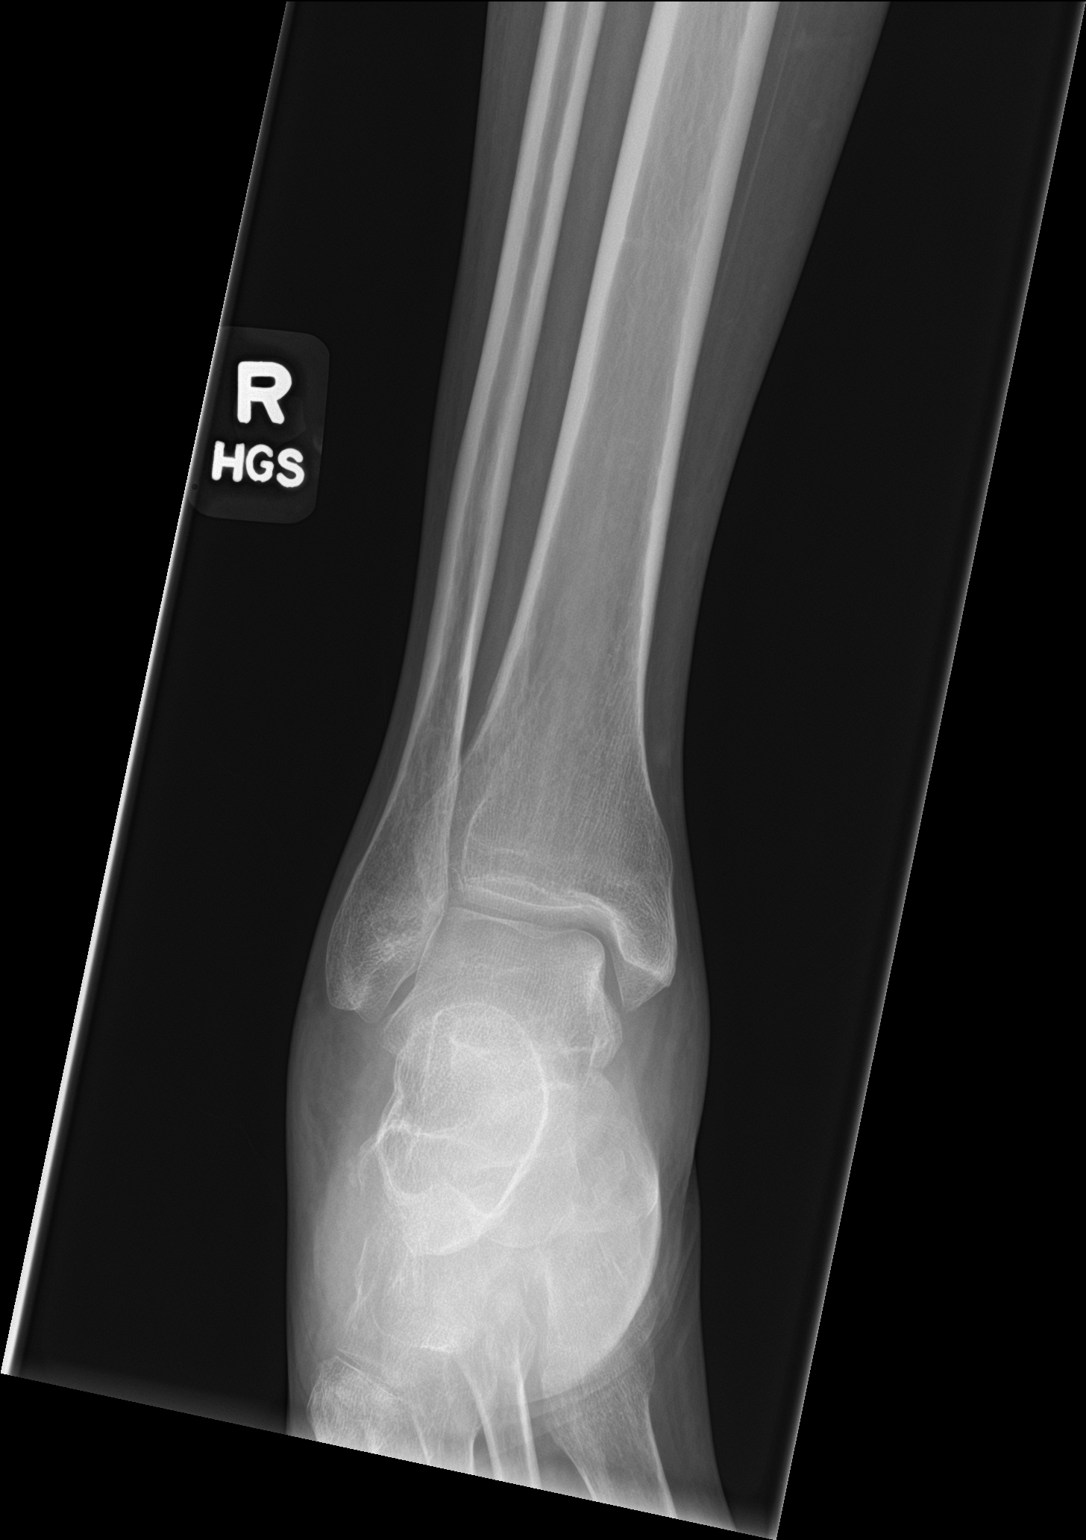

[ankle obl]
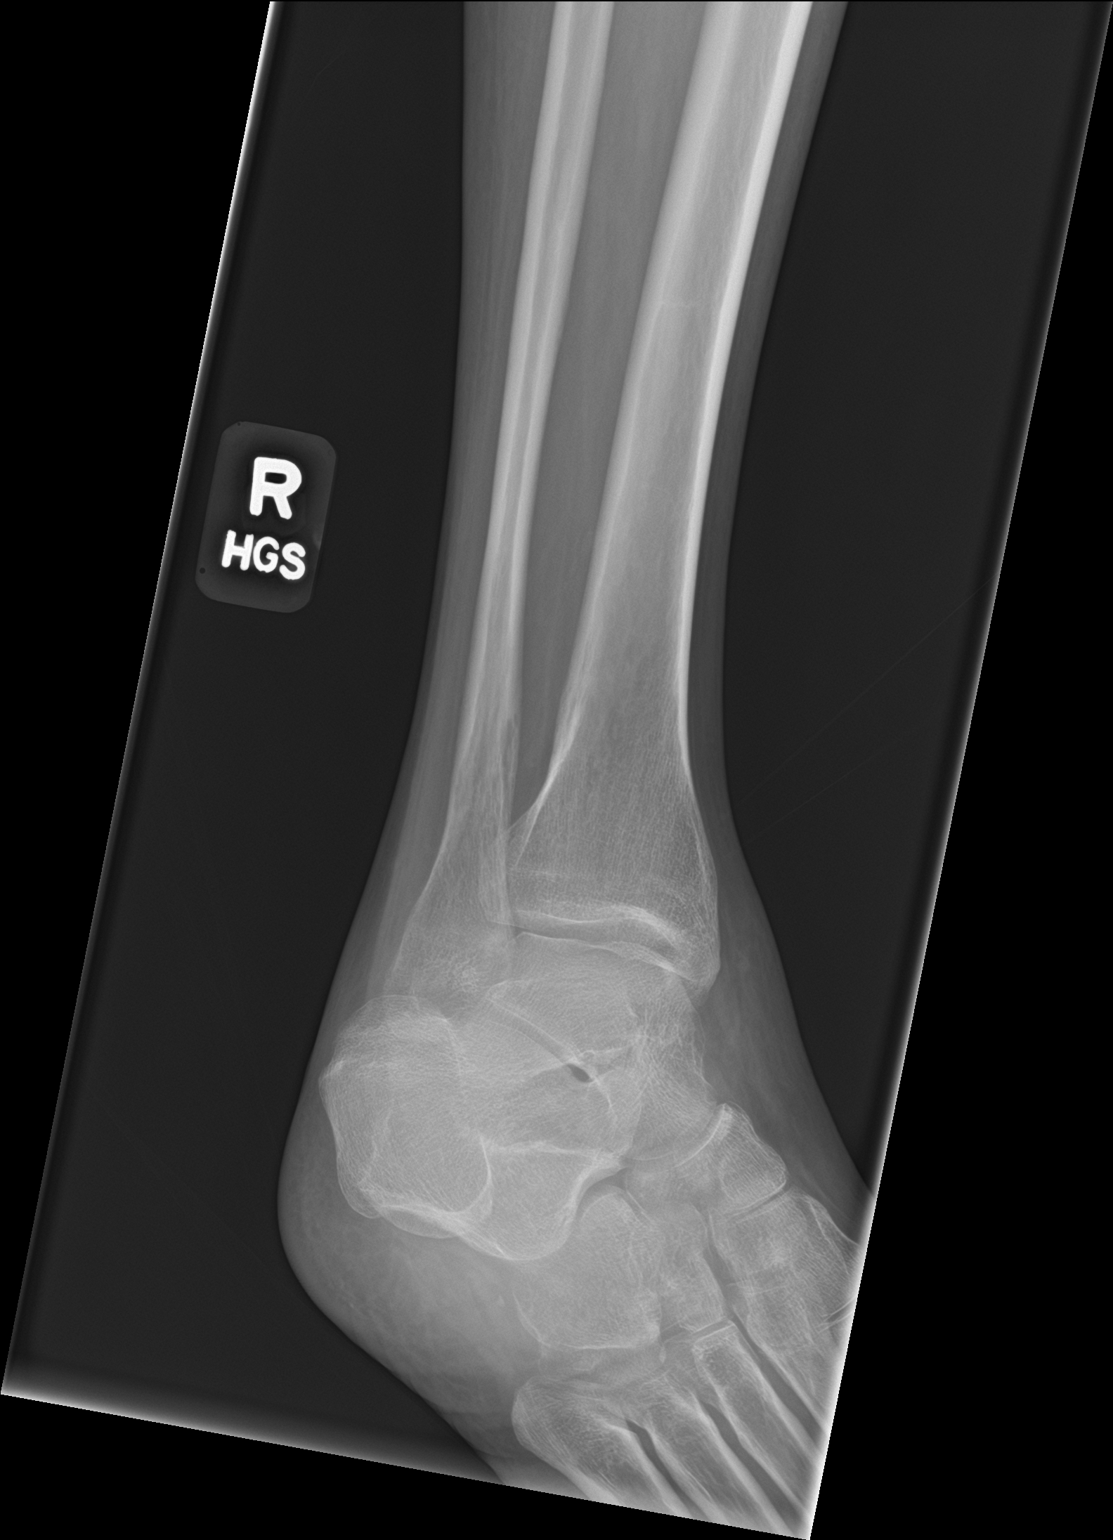

[ankle lat]
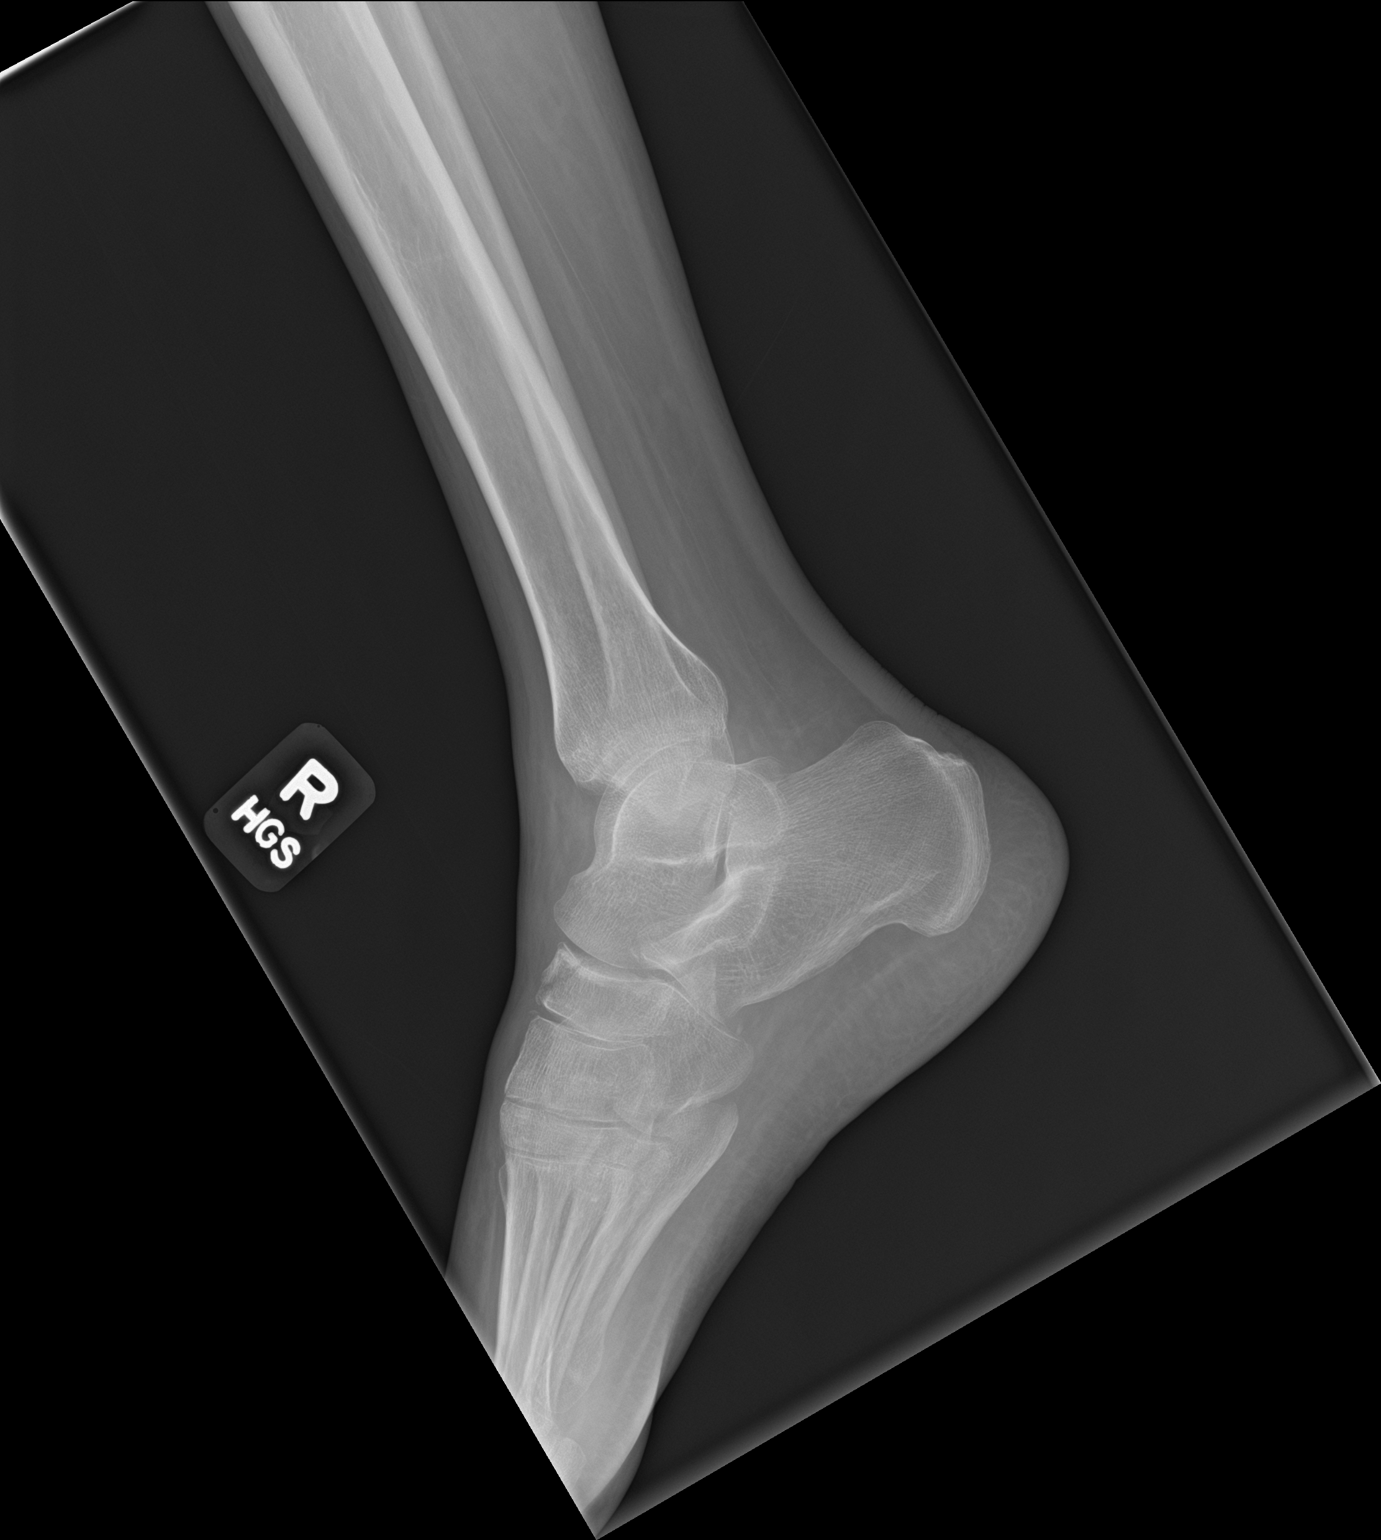

[3 of 3 positions shown; findings below may reference images not displayed]

FINDINGS: Fractures noted along the anterior aspect of the right navicular and
medial cuneiform. No subluxation or dislocation.
IMPRESSION: Fractures along the anterior aspect of the navicular and medial
cuneiform.

## 2019-05-29 IMAGING — CR DG FOOT COMPLETE 3+V*R*
3 series · 3 of 3 positions shown · non-contrast
Comparison: None.

CLINICAL DATA: Fall, right foot injury

EXAM:
RIGHT FOOT COMPLETE - 3+ VIEW

[foot ap]
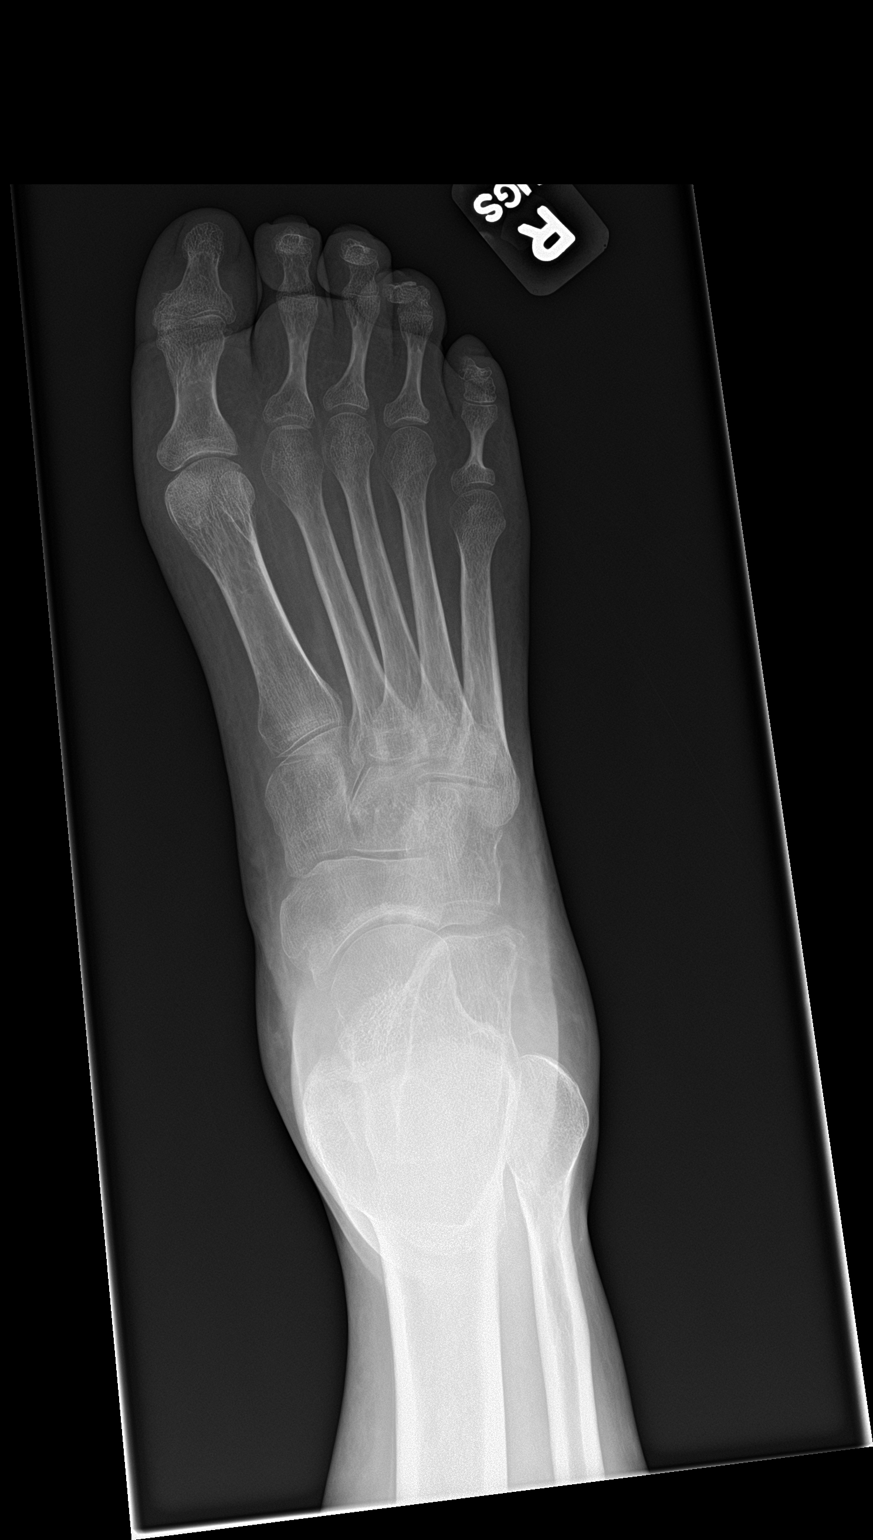

[foot obl]
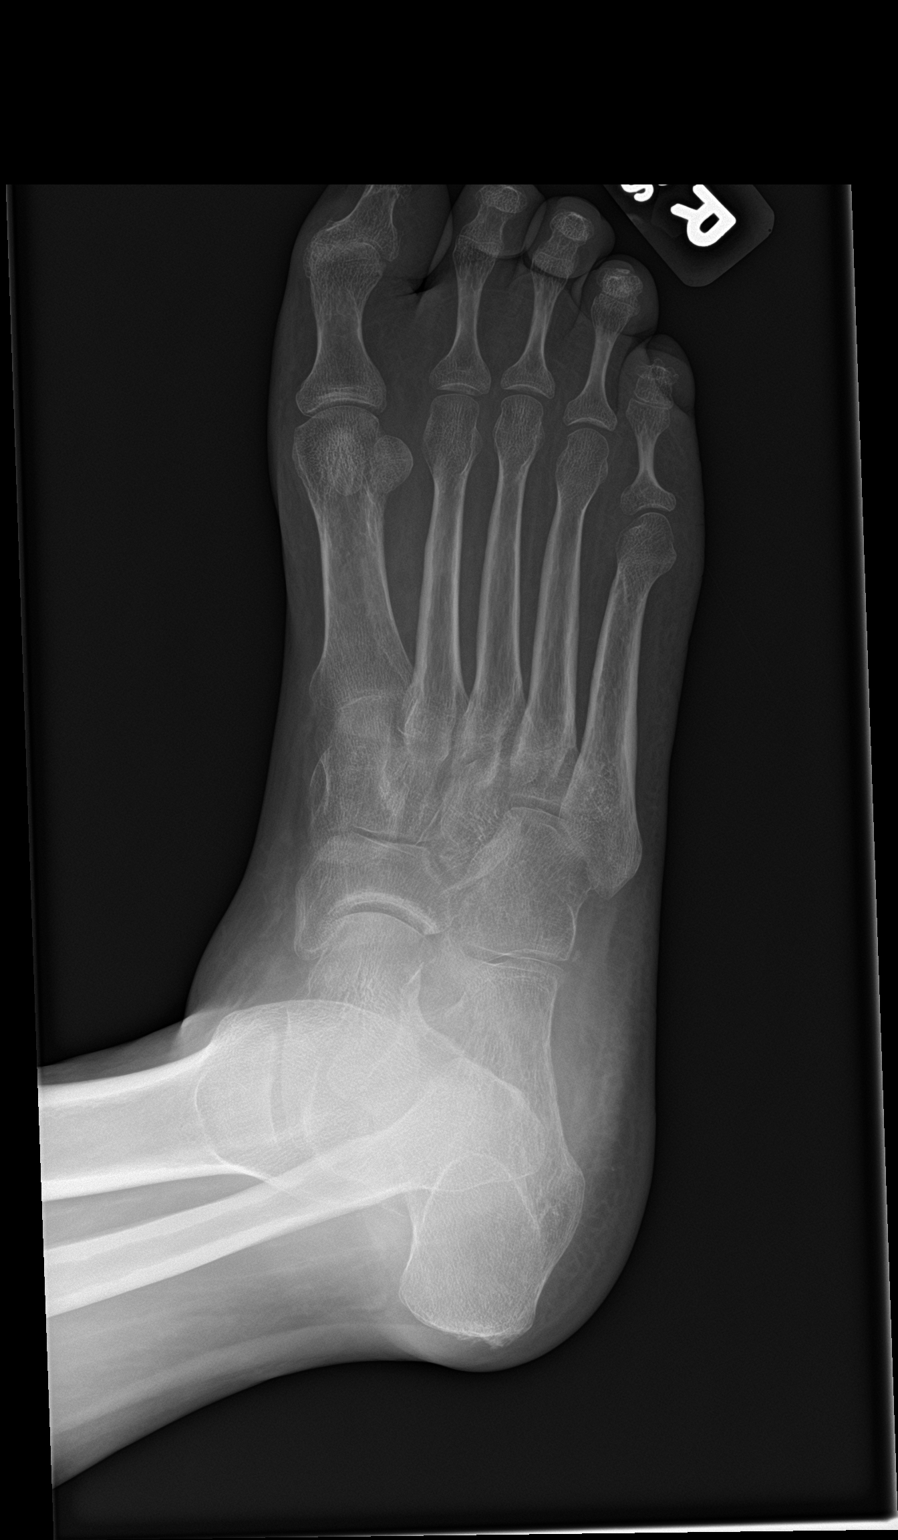

[foot lat]
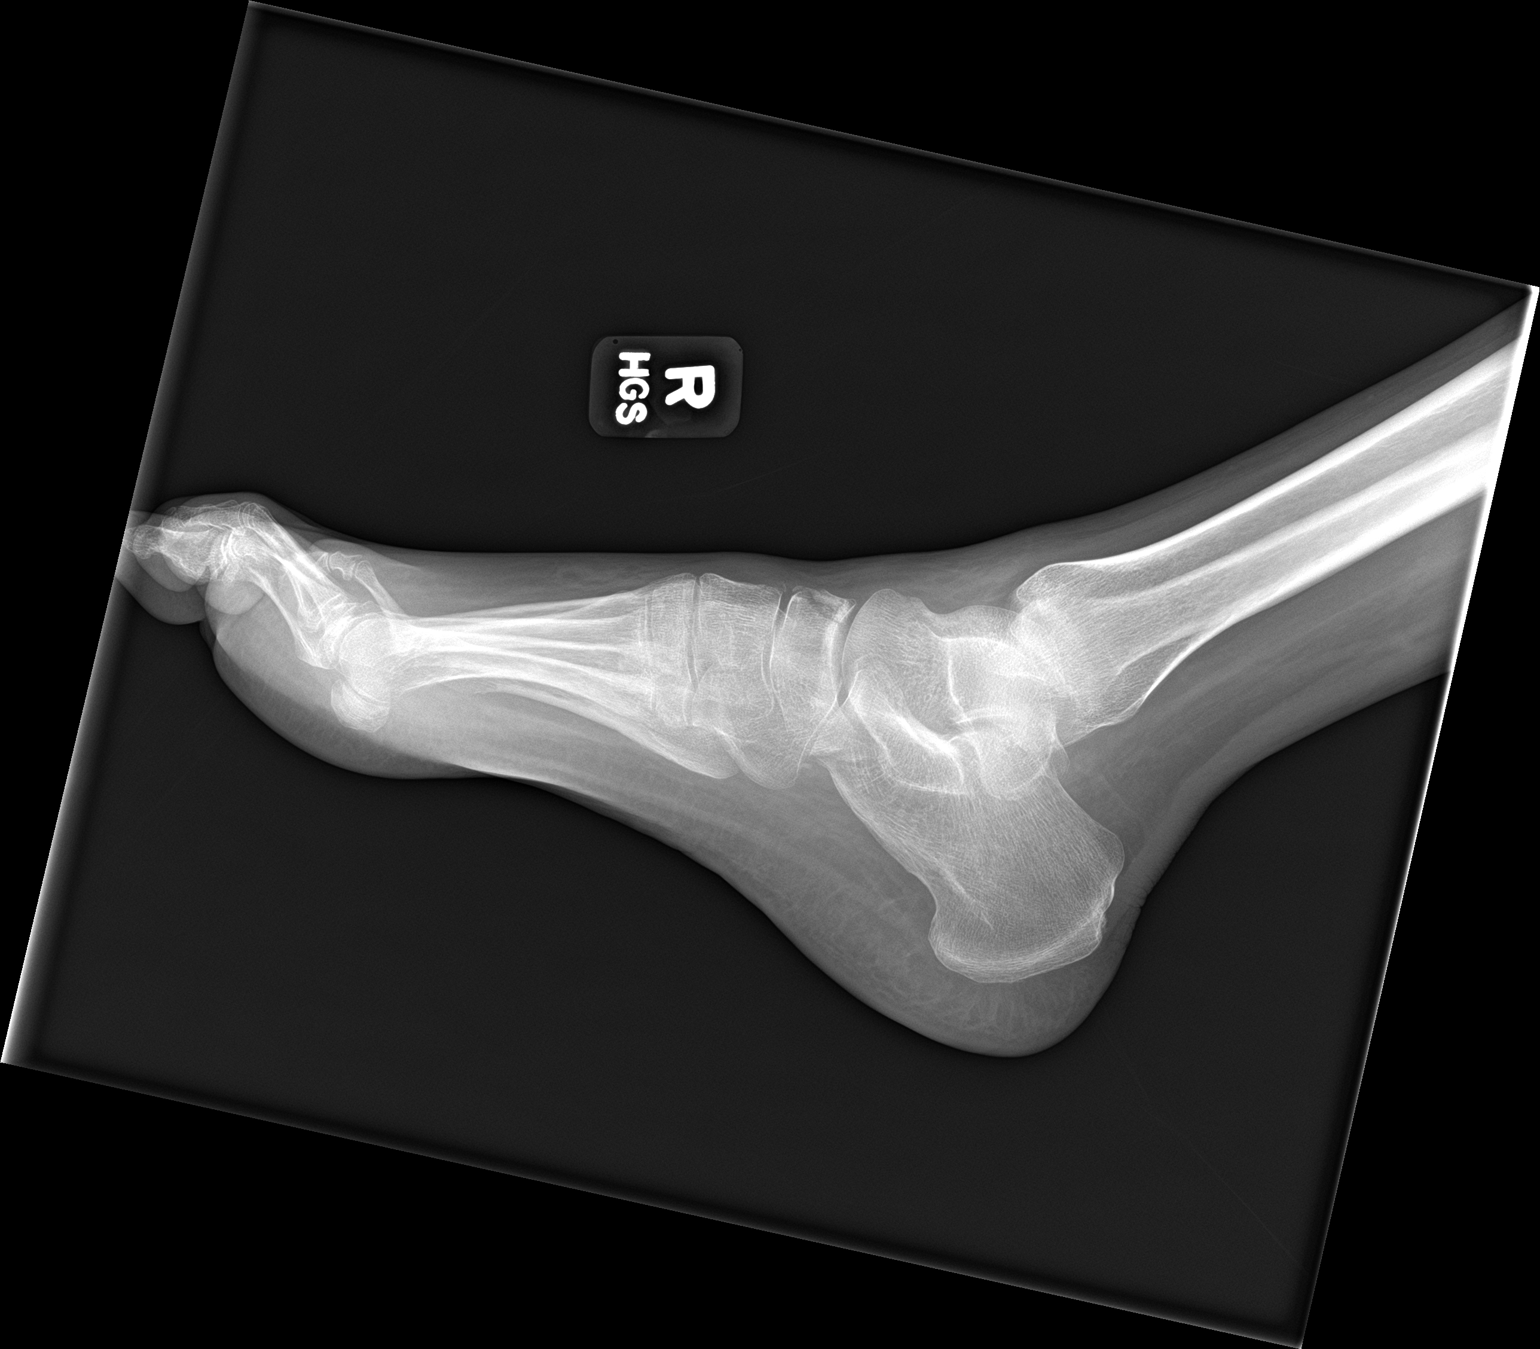

[3 of 3 positions shown; findings below may reference images not displayed]

FINDINGS: Fracture noted along the anterior aspect of the navicular bone.
Small avulsed fragment off the anterior aspect of the medial
cuneiform mild joint space narrowing in the 1st MTP joint and
partial fusion at the 1st IP joint.
IMPRESSION: Fractures along the anterior aspect of the navicular and medial
cuneiform.

## 2019-05-30 DIAGNOSIS — J0101 Acute recurrent maxillary sinusitis: Secondary | ICD-10-CM | POA: Diagnosis not present

## 2019-05-30 DIAGNOSIS — T7840XD Allergy, unspecified, subsequent encounter: Secondary | ICD-10-CM | POA: Diagnosis not present

## 2019-05-30 DIAGNOSIS — F17200 Nicotine dependence, unspecified, uncomplicated: Secondary | ICD-10-CM | POA: Diagnosis not present

## 2019-06-03 ENCOUNTER — Ambulatory Visit: Payer: Medicare Other | Admitting: Dermatology

## 2019-06-03 DIAGNOSIS — J3089 Other allergic rhinitis: Secondary | ICD-10-CM | POA: Diagnosis not present

## 2019-06-12 DIAGNOSIS — J3089 Other allergic rhinitis: Secondary | ICD-10-CM | POA: Diagnosis not present

## 2019-06-19 DIAGNOSIS — H1045 Other chronic allergic conjunctivitis: Secondary | ICD-10-CM | POA: Diagnosis not present

## 2019-06-19 DIAGNOSIS — J3089 Other allergic rhinitis: Secondary | ICD-10-CM | POA: Diagnosis not present

## 2019-06-19 DIAGNOSIS — F17200 Nicotine dependence, unspecified, uncomplicated: Secondary | ICD-10-CM | POA: Diagnosis not present

## 2019-06-19 DIAGNOSIS — R21 Rash and other nonspecific skin eruption: Secondary | ICD-10-CM | POA: Diagnosis not present

## 2019-06-20 ENCOUNTER — Other Ambulatory Visit: Payer: Self-pay

## 2019-06-20 ENCOUNTER — Ambulatory Visit (INDEPENDENT_AMBULATORY_CARE_PROVIDER_SITE_OTHER): Payer: Medicare HMO | Admitting: Dermatology

## 2019-06-20 DIAGNOSIS — L853 Xerosis cutis: Secondary | ICD-10-CM

## 2019-06-20 DIAGNOSIS — L2089 Other atopic dermatitis: Secondary | ICD-10-CM | POA: Diagnosis not present

## 2019-06-20 DIAGNOSIS — L810 Postinflammatory hyperpigmentation: Secondary | ICD-10-CM

## 2019-06-20 DIAGNOSIS — L819 Disorder of pigmentation, unspecified: Secondary | ICD-10-CM

## 2019-06-20 MED ORDER — DUPIXENT 300 MG/2ML ~~LOC~~ SOSY: 300.0000 mg | PREFILLED_SYRINGE | SUBCUTANEOUS | 5 refills | Status: DC

## 2019-06-20 NOTE — Patient Instructions (Signed)
Dupilumab (Dupixent) is a treatment given by injection for adults with moderate-to-severe atopic dermatitis. It is given as 2 injections at the first dose followed by 1 injection ever 2 weeks thereafter.  Potential side effects include allergic reaction, herpes infections, injection site reactions and conjunctivitis (inflammation of the eyes). 

## 2019-06-20 NOTE — Progress Notes (Signed)
   Follow-Up Visit   Subjective  Kristine Campbell is a 39 y.o. female who presents for the following: atopic dermatitis (7 months f/u AD pt taking Dupixent injection qowk with a good response,). Pt  c/o of eye pain and pressure right under the surface of the skin below her left eye.  Her eyes aren't irritated, red, or itchy.  She went to see her allergist yesterday and she was given her routine allergy shot, she is scheduled  to see her eye doctor June 26, 2019.    The following portions of the chart were reviewed this encounter and updated as appropriate:     Review of Systems: No other skin or systemic complaints.  Objective  Well appearing patient in no apparent distress; mood and affect are within normal limits.  All skin waist up examined.  Objective  face,arms.chest,back: Hyperpigmented macules arms (no erythema), xerosis on back with scattered hyperpigmented macules   Assessment & Plan  Disorder of pigmentation  Other atopic dermatitis face,arms.chest,back  With post-inflammatory hyperpigmentation Well-controlled on Dupixent Cont Dupixent injection every 2 weeks   Discussed with pt that the infraocular pain symptoms she is experiencing is not typical side effect to Dupixent injections, which generally causes symptoms in the eye itself (redness, irritation, dryness) and generally would involve both eyes.  She should keep scheduled appt with optometrist.   Could d/c Dupixent injection to see if that will help with eye discomfort if no other cause is found.  Pt would prefer to stay on Dupixent at this time since it has helped her skin so much.  Ordered Medications: dupilumab (DUPIXENT) 300 MG/2ML prefilled syringe  Xerosis cutis Xerosis - diffuse xerotic patches - recommend gentle, hydrating skin care - gentle skin care handout given  Return in about 6 months (around 12/20/2019) for Dupixent .

## 2019-06-26 DIAGNOSIS — H35363 Drusen (degenerative) of macula, bilateral: Secondary | ICD-10-CM | POA: Diagnosis not present

## 2019-06-26 DIAGNOSIS — H52223 Regular astigmatism, bilateral: Secondary | ICD-10-CM | POA: Diagnosis not present

## 2019-06-26 DIAGNOSIS — H40013 Open angle with borderline findings, low risk, bilateral: Secondary | ICD-10-CM | POA: Diagnosis not present

## 2019-06-26 DIAGNOSIS — H53142 Visual discomfort, left eye: Secondary | ICD-10-CM | POA: Diagnosis not present

## 2019-06-26 DIAGNOSIS — H5203 Hypermetropia, bilateral: Secondary | ICD-10-CM | POA: Diagnosis not present

## 2019-07-03 DIAGNOSIS — J3089 Other allergic rhinitis: Secondary | ICD-10-CM | POA: Diagnosis not present

## 2019-07-10 ENCOUNTER — Telehealth: Payer: Self-pay

## 2019-07-10 DIAGNOSIS — J3089 Other allergic rhinitis: Secondary | ICD-10-CM | POA: Diagnosis not present

## 2019-07-16 NOTE — Telephone Encounter (Signed)
error 

## 2019-07-17 DIAGNOSIS — J3089 Other allergic rhinitis: Secondary | ICD-10-CM | POA: Diagnosis not present

## 2019-07-24 ENCOUNTER — Ambulatory Visit: Payer: Medicare HMO

## 2019-07-24 DIAGNOSIS — J3089 Other allergic rhinitis: Secondary | ICD-10-CM | POA: Diagnosis not present

## 2019-08-07 DIAGNOSIS — J3089 Other allergic rhinitis: Secondary | ICD-10-CM | POA: Diagnosis not present

## 2019-08-12 DIAGNOSIS — J3089 Other allergic rhinitis: Secondary | ICD-10-CM | POA: Diagnosis not present

## 2019-08-21 DIAGNOSIS — J3089 Other allergic rhinitis: Secondary | ICD-10-CM | POA: Diagnosis not present

## 2019-09-02 DIAGNOSIS — J3089 Other allergic rhinitis: Secondary | ICD-10-CM | POA: Diagnosis not present

## 2019-09-23 DIAGNOSIS — J3089 Other allergic rhinitis: Secondary | ICD-10-CM | POA: Diagnosis not present

## 2019-10-02 DIAGNOSIS — J3089 Other allergic rhinitis: Secondary | ICD-10-CM | POA: Diagnosis not present

## 2019-10-07 DIAGNOSIS — J3089 Other allergic rhinitis: Secondary | ICD-10-CM | POA: Diagnosis not present

## 2019-10-30 DIAGNOSIS — J3089 Other allergic rhinitis: Secondary | ICD-10-CM | POA: Diagnosis not present

## 2019-11-11 DIAGNOSIS — J3089 Other allergic rhinitis: Secondary | ICD-10-CM | POA: Diagnosis not present

## 2019-11-18 ENCOUNTER — Other Ambulatory Visit: Payer: Self-pay

## 2019-11-18 DIAGNOSIS — L2089 Other atopic dermatitis: Secondary | ICD-10-CM

## 2019-11-18 MED ORDER — DUPIXENT 300 MG/2ML ~~LOC~~ SOSY: 300.0000 mg | PREFILLED_SYRINGE | SUBCUTANEOUS | 5 refills | Status: DC

## 2019-11-18 NOTE — Progress Notes (Signed)
rx refill

## 2019-11-25 DIAGNOSIS — J3089 Other allergic rhinitis: Secondary | ICD-10-CM | POA: Diagnosis not present

## 2019-12-02 DIAGNOSIS — J3089 Other allergic rhinitis: Secondary | ICD-10-CM | POA: Diagnosis not present

## 2019-12-03 DIAGNOSIS — L209 Atopic dermatitis, unspecified: Secondary | ICD-10-CM | POA: Diagnosis not present

## 2019-12-03 DIAGNOSIS — G600 Hereditary motor and sensory neuropathy: Secondary | ICD-10-CM | POA: Diagnosis not present

## 2019-12-03 DIAGNOSIS — Z0000 Encounter for general adult medical examination without abnormal findings: Secondary | ICD-10-CM | POA: Diagnosis not present

## 2019-12-03 DIAGNOSIS — J309 Allergic rhinitis, unspecified: Secondary | ICD-10-CM | POA: Diagnosis not present

## 2019-12-08 ENCOUNTER — Ambulatory Visit: Payer: Medicare HMO | Admitting: Dermatology

## 2019-12-10 ENCOUNTER — Other Ambulatory Visit: Payer: Self-pay

## 2019-12-10 ENCOUNTER — Ambulatory Visit (INDEPENDENT_AMBULATORY_CARE_PROVIDER_SITE_OTHER): Payer: Medicare HMO | Admitting: Dermatology

## 2019-12-10 DIAGNOSIS — L2089 Other atopic dermatitis: Secondary | ICD-10-CM | POA: Diagnosis not present

## 2019-12-10 NOTE — Patient Instructions (Signed)
Dupilumab (Dupixent) is a treatment given by injection for adults with moderate-to-severe atopic dermatitis. Goal is control of skin condition, not cure. It is given as 2 injections at the first dose followed by 1 injection ever 2 weeks thereafter.  Potential side effects include allergic reaction, herpes infections, injection site reactions and conjunctivitis (inflammation of the eyes).  The use of Dupixent requires long term medication management, including periodic office visits.  

## 2019-12-10 NOTE — Progress Notes (Signed)
   Follow-Up Visit   Subjective  Kristine Campbell is a 39 y.o. female who presents for the following: Eczema (6 month recheck, atopic dermatitis. Has neen using Dupixent. Denies injection site reactions or adverse effects. Itching and rash are controlled.).  Doing very well and pt is happy with results.  No eye symptoms.    The following portions of the chart were reviewed this encounter and updated as appropriate:     Review of Systems: No other skin or systemic complaints except as noted in HPI or Assessment and Plan.  Objective  Well appearing patient in no apparent distress; mood and affect are within normal limits.  A focused examination was performed including face, back, abdomen, arms. Relevant physical exam findings are noted in the Assessment and Plan.  Objective  Left Forearm - Posterior: Hyperpigmented macules on arms, light pigmented macules at bilateral flanks  Assessment & Plan  Other atopic dermatitis Left Forearm - Posterior  Well-controlled on Dupixent  Continue moisturizer BID as directed.  Continue Dupixent as directed qowk.   Dupilumab (Dupixent) is a treatment given by injection for adults with moderate-to-severe atopic dermatitis. Goal is control of skin condition, not cure. It is given as 2 injections at the first dose followed by 1 injection ever 2 weeks thereafter.  Potential side effects include allergic reaction, herpes infections, injection site reactions and conjunctivitis (inflammation of the eyes).  The use of Dupixent requires long term medication management, including periodic office visits.   Other Related Medications dupilumab (DUPIXENT) 300 MG/2ML prefilled syringe  Return in about 6 months (around 06/09/2020) for atopic dermatitis/Dupixent recheck.   I, Lawson Radar, CMA, am acting as scribe for Willeen Niece, MD.  Documentation: I have reviewed the above documentation for accuracy and completeness, and I agree with the above.  Willeen Niece MD

## 2019-12-16 DIAGNOSIS — J3089 Other allergic rhinitis: Secondary | ICD-10-CM | POA: Diagnosis not present

## 2019-12-25 DIAGNOSIS — J3089 Other allergic rhinitis: Secondary | ICD-10-CM | POA: Diagnosis not present

## 2020-01-01 DIAGNOSIS — J3089 Other allergic rhinitis: Secondary | ICD-10-CM | POA: Diagnosis not present

## 2020-01-06 DIAGNOSIS — J3089 Other allergic rhinitis: Secondary | ICD-10-CM | POA: Diagnosis not present

## 2020-01-13 DIAGNOSIS — J3089 Other allergic rhinitis: Secondary | ICD-10-CM | POA: Diagnosis not present

## 2020-01-22 DIAGNOSIS — J3089 Other allergic rhinitis: Secondary | ICD-10-CM | POA: Diagnosis not present

## 2020-01-25 ENCOUNTER — Encounter: Payer: Self-pay | Admitting: Emergency Medicine

## 2020-01-25 ENCOUNTER — Other Ambulatory Visit: Payer: Self-pay

## 2020-01-25 ENCOUNTER — Emergency Department
Admission: EM | Admit: 2020-01-25 | Discharge: 2020-01-25 | Disposition: A | Discharge status: Home / Self Care | Payer: Medicare HMO | Attending: Emergency Medicine | Admitting: Emergency Medicine

## 2020-01-25 DIAGNOSIS — M549 Dorsalgia, unspecified: Secondary | ICD-10-CM | POA: Diagnosis not present

## 2020-01-25 DIAGNOSIS — M791 Myalgia, unspecified site: Secondary | ICD-10-CM | POA: Insufficient documentation

## 2020-01-25 DIAGNOSIS — M7918 Myalgia, other site: Secondary | ICD-10-CM

## 2020-01-25 DIAGNOSIS — M542 Cervicalgia: Secondary | ICD-10-CM | POA: Insufficient documentation

## 2020-01-25 DIAGNOSIS — M546 Pain in thoracic spine: Secondary | ICD-10-CM | POA: Diagnosis not present

## 2020-01-25 DIAGNOSIS — Z87891 Personal history of nicotine dependence: Secondary | ICD-10-CM | POA: Diagnosis not present

## 2020-01-25 MED ORDER — KETOROLAC TROMETHAMINE 10 MG PO TABS: 10.0000 mg | ORAL_TABLET | Freq: Three times a day (TID) | ORAL | 0 refills | Status: DC

## 2020-01-25 MED ORDER — CYCLOBENZAPRINE HCL 5 MG PO TABS: 5.0000 mg | ORAL_TABLET | Freq: Three times a day (TID) | ORAL | 0 refills | Status: DC | PRN

## 2020-01-25 MED ORDER — KETOROLAC TROMETHAMINE 30 MG/ML IJ SOLN
30.0000 mg | Freq: Once | INTRAMUSCULAR | Status: AC
  Administered 2020-01-25: 30 mg via INTRAMUSCULAR
  Filled 2020-01-25: qty 1

## 2020-01-25 NOTE — ED Provider Notes (Signed)
[facility] Emergency Department Provider Note ____________________________________________  Time seen: 2232  I have reviewed the triage vital signs and the nursing notes.  HISTORY  Chief Complaint  Neck Pain  HPI Kristine Campbell is a 39 y.o. female patient with ED evaluation of a 1 week complaint of bilateral neck and upper back pain.  She denies any preceding injury, accident, trauma, fall.  She also denies any distal paresthesias or grip changes.  She has been taking ibuprofen  without benefit.  She presents now for evaluation of her symptoms.  Past Medical History:  Diagnosis Date  . Allergy   . Charcot-Marie-Tooth disease   . Charcot-Marie-Tooth disease   . CMTD (Charcot-Marie-Tooth disease)     Patient Active Problem List   Diagnosis Date Noted  . Atopic dermatitis 05/17/2019    Past Surgical History:  Procedure Laterality Date  . broken foot Right    from fall  . CESAREAN SECTION    . MR LOWER LEG LEFT ([facility] HX) Left 2015   patient states she has screws/rods  . OVARIAN CYST REMOVAL      Prior to Admission medications   Medication Sig Start Date End Date Taking? Authorizing Provider  cyclobenzaprine (FLEXERIL) 5 MG tablet Take 1 tablet (5 mg total) by mouth 3 (three) times daily as needed. 01/25/20   Masahiro Iglesia, Charlesetta Ivory, PA-C  diphenhydrAMINE (BENADRYL) 25 mg capsule Take 25 mg by mouth every 6 (six) hours as needed.    [provider]  dupilumab (DUPIXENT) 300 MG/2ML prefilled syringe Inject 300 mg into the skin every 14 (fourteen) days. Starting at day 15 for maintenance. 11/18/19   Deirdre Evener, MD  ketorolac (TORADOL) 10 MG tablet Take 1 tablet (10 mg total) by mouth every 8 (eight) hours. 01/25/20   Tameka Hoiland, Charlesetta Ivory, PA-C  levocetirizine (XYZAL) 5 MG tablet Take 5 mg by mouth every evening.    [provider]  pregabalin (LYRICA) 100 MG capsule Take 100 mg by mouth 3 (three) times daily.    [provider]    Allergies Other  History reviewed. No pertinent family history.  Social History Social History   Tobacco Use  . Smoking status: Former Smoker    Years: 5.00    Types: Cigars  . Smokeless tobacco: Never Used  . Tobacco comment: Smokes 2-3 cigars/day  Substance Use Topics  . Alcohol use: No  . Drug use: Not Currently    Types: Marijuana    Comment: smokes 2-3 day for 15 years  (04/21/19)    Review of Systems  Constitutional: Negative for fever. Eyes: Negative for visual changes. ENT: Negative for sore throat. Cardiovascular: Negative for chest pain. Respiratory: Negative for shortness of breath. Gastrointestinal: Negative for abdominal pain, vomiting and diarrhea. Genitourinary: Negative for dysuria. Musculoskeletal: Negative for back pain. Reports neck pain. Skin: Negative for rash. Neurological: Negative for headaches, focal weakness or numbness. ____________________________________________  PHYSICAL EXAM:  VITAL SIGNS: ED Triage Vitals [01/25/20 2151]  Enc Vitals Group     BP (!) 150/102     Pulse Rate (!) 118     Resp 18     Temp 98.2 F (36.8 C)     Temp Source Oral     SpO2 100 %     Weight 130 lb (59 kg)     Height 5\' 8"  (1.727 m)     Head Circumference      Peak Flow      Pain Score 9  Pain Loc      Pain Edu?      Excl. in GC?     Constitutional: Alert and oriented. Well appearing and in no distress. Head: Normocephalic and atraumatic. Eyes: Conjunctivae are normal. Normal extraocular movements Neck: Supple. Normal ROM without crepitus.  No spasm, deformity, or step-off is appreciated.  No thyromegaly. Cardiovascular: Normal rate, regular rhythm. Normal distal pulses. Respiratory: Normal respiratory effort. No wheezes/rales/rhonchi. Gastrointestinal: Soft and nontender. No distention. Musculoskeletal: Nontender with normal range of motion in all extremities.  Neurologic: CN II-XII grossly intact. Normal gait without ataxia.  Normal speech and language. No gross focal neurologic deficits are appreciated. Skin:  Skin is warm, dry and intact. No rash noted. Psychiatric: Mood and affect are normal. Patient exhibits appropriate insight and judgment. ____________________________________________  PROCEDURES  Toradol 30 mg IM Procedures ____________________________________________  INITIAL IMPRESSION / ASSESSMENT AND PLAN / ED COURSE  Patient with ED evaluation of bilateral neck and upper back pain for the last week.  Exam is without any red flags or neuromuscular deficits.  Patient be discharged with prescriptions for anti-inflammatory muscle relaxants.  She will follow-up with her primary provider for ongoing symptoms.  [name] was evaluated in Emergency Department on 01/25/2020 for the symptoms described in the history of present illness. She was evaluated in the context of the global COVID-19 pandemic, which necessitated consideration that the patient might be at risk for infection with the SARS-CoV-2 virus that causes COVID-19. Institutional protocols and algorithms that pertain to the evaluation of patients at risk for COVID-19 are in a state of rapid change based on information released by regulatory bodies including the CDC and federal and state organizations. These policies and algorithms were followed during the patient's care in the ED. ____________________________________________  FINAL CLINICAL IMPRESSION(S) / ED DIAGNOSES  Final diagnoses:  Neck pain  Musculoskeletal pain      Blayde Bacigalupi, Charlesetta Ivory, PA-C 01/25/20 2256    Phineas Semen, MD 01/25/20 2302

## 2020-01-25 NOTE — ED Notes (Signed)
Pt c/o 8/10 pain in neck, shoulders and throat. Pt states it has been going on about "a week". Pt able to ambulate to room with steady gait.  Pt's family bedside.

## 2020-01-25 NOTE — ED Triage Notes (Signed)
Patient with complaint of neck pain times one week. Patient denies injury. Patient states that she has taken OTC medications with no relief.

## 2020-01-25 NOTE — Discharge Instructions (Addendum)
Your exam is normal.  Be treated with an anti-inflammatory and muscle relaxant.  Follow-up with your primary provider for ongoing symptoms.  Return to the ED if needed.

## 2020-02-05 DIAGNOSIS — J3089 Other allergic rhinitis: Secondary | ICD-10-CM | POA: Diagnosis not present

## 2020-02-06 DIAGNOSIS — K219 Gastro-esophageal reflux disease without esophagitis: Secondary | ICD-10-CM | POA: Diagnosis not present

## 2020-02-06 DIAGNOSIS — R198 Other specified symptoms and signs involving the digestive system and abdomen: Secondary | ICD-10-CM | POA: Diagnosis not present

## 2020-02-06 DIAGNOSIS — R000 Tachycardia, unspecified: Secondary | ICD-10-CM | POA: Diagnosis not present

## 2020-02-10 DIAGNOSIS — J3089 Other allergic rhinitis: Secondary | ICD-10-CM | POA: Diagnosis not present

## 2020-02-12 DIAGNOSIS — J3089 Other allergic rhinitis: Secondary | ICD-10-CM | POA: Diagnosis not present

## 2020-02-19 DIAGNOSIS — J3089 Other allergic rhinitis: Secondary | ICD-10-CM | POA: Diagnosis not present

## 2020-02-20 DIAGNOSIS — R000 Tachycardia, unspecified: Secondary | ICD-10-CM | POA: Diagnosis not present

## 2020-02-20 DIAGNOSIS — K219 Gastro-esophageal reflux disease without esophagitis: Secondary | ICD-10-CM | POA: Diagnosis not present

## 2020-02-20 DIAGNOSIS — R718 Other abnormality of red blood cells: Secondary | ICD-10-CM | POA: Diagnosis not present

## 2020-02-24 DIAGNOSIS — J3089 Other allergic rhinitis: Secondary | ICD-10-CM | POA: Diagnosis not present

## 2020-03-02 DIAGNOSIS — J3089 Other allergic rhinitis: Secondary | ICD-10-CM | POA: Diagnosis not present

## 2020-03-11 DIAGNOSIS — J3089 Other allergic rhinitis: Secondary | ICD-10-CM | POA: Diagnosis not present

## 2020-03-12 DIAGNOSIS — R198 Other specified symptoms and signs involving the digestive system and abdomen: Secondary | ICD-10-CM | POA: Diagnosis not present

## 2020-03-12 DIAGNOSIS — R000 Tachycardia, unspecified: Secondary | ICD-10-CM | POA: Diagnosis not present

## 2020-03-16 DIAGNOSIS — D509 Iron deficiency anemia, unspecified: Secondary | ICD-10-CM | POA: Diagnosis not present

## 2020-03-16 DIAGNOSIS — J3089 Other allergic rhinitis: Secondary | ICD-10-CM | POA: Diagnosis not present

## 2020-03-30 DIAGNOSIS — J3089 Other allergic rhinitis: Secondary | ICD-10-CM | POA: Diagnosis not present

## 2020-04-08 DIAGNOSIS — R21 Rash and other nonspecific skin eruption: Secondary | ICD-10-CM | POA: Diagnosis not present

## 2020-04-08 DIAGNOSIS — H1045 Other chronic allergic conjunctivitis: Secondary | ICD-10-CM | POA: Diagnosis not present

## 2020-04-08 DIAGNOSIS — F17200 Nicotine dependence, unspecified, uncomplicated: Secondary | ICD-10-CM | POA: Diagnosis not present

## 2020-04-08 DIAGNOSIS — J3089 Other allergic rhinitis: Secondary | ICD-10-CM | POA: Diagnosis not present

## 2020-04-13 DIAGNOSIS — J3089 Other allergic rhinitis: Secondary | ICD-10-CM | POA: Diagnosis not present

## 2020-04-15 DIAGNOSIS — J3089 Other allergic rhinitis: Secondary | ICD-10-CM | POA: Diagnosis not present

## 2020-04-20 DIAGNOSIS — J3089 Other allergic rhinitis: Secondary | ICD-10-CM | POA: Diagnosis not present

## 2020-04-26 ENCOUNTER — Other Ambulatory Visit: Payer: Self-pay

## 2020-04-26 ENCOUNTER — Encounter: Payer: Self-pay | Admitting: Emergency Medicine

## 2020-04-26 ENCOUNTER — Emergency Department
Admission: EM | Admit: 2020-04-26 | Discharge: 2020-04-26 | Disposition: A | Discharge status: Home / Self Care | Payer: Medicare HMO | Attending: Emergency Medicine | Admitting: Emergency Medicine

## 2020-04-26 DIAGNOSIS — R221 Localized swelling, mass and lump, neck: Secondary | ICD-10-CM

## 2020-04-26 DIAGNOSIS — F17290 Nicotine dependence, other tobacco product, uncomplicated: Secondary | ICD-10-CM | POA: Insufficient documentation

## 2020-04-26 DIAGNOSIS — J392 Other diseases of pharynx: Secondary | ICD-10-CM | POA: Insufficient documentation

## 2020-04-26 LAB — GROUP A STREP BY PCR: Group A Strep by PCR: NOT DETECTED

## 2020-04-26 MED ORDER — DEXAMETHASONE SODIUM PHOSPHATE 10 MG/ML IJ SOLN: 10.0000 mg | Freq: Once | INTRAMUSCULAR | Status: DC

## 2020-04-26 NOTE — ED Notes (Signed)
See triage note  Presents with neck pain and occasional sore throat for about 1 month   States she has been seen for same in past and dx'd with muscle spasms  Afebrile on arrival

## 2020-04-26 NOTE — ED Triage Notes (Signed)
Pt in with c/o throat swelling/tightness x 1 mo. Denies any sob or recent illness. Feels like "mucus is caught in back of throat". No itching, swelling or hives noted.

## 2020-04-26 NOTE — ED Provider Notes (Signed)
[facility] Emergency Department Provider Note ____________________________________________  Time seen: 1330  I have reviewed the triage vital signs and the nursing notes.  HISTORY  Chief Complaint  Throat swelling   HPI Kristine Campbell is a 40 y.o. female presents to the ER today with complaint of throat tightness.  She reports this started 1 month ago.  She reports this has been persistent but not necessarily worse.  She has been having some painful swallowing but denies hoarseness, cough, shortness of breath, nausea, vomiting or reflux.  She denies runny nose, nasal congestion, ear pain, loss of taste/smell, fever, chills or body aches.  She feels like she has drainage.  She denies any choking episode.  She denies neck pain or neck injury.  She has tried an antihistamine OTC with minimal relief of symptoms.  Past Medical History:  Diagnosis Date  . Allergy   . Charcot-Marie-Tooth disease   . Charcot-Marie-Tooth disease   . CMTD (Charcot-Marie-Tooth disease)     Patient Active Problem List   Diagnosis Date Noted  . Atopic dermatitis 05/17/2019    Past Surgical History:  Procedure Laterality Date  . broken foot Right    from fall  . CESAREAN SECTION    . MR LOWER LEG LEFT ([facility] HX) Left 2015   patient states she has screws/rods  . OVARIAN CYST REMOVAL      Prior to Admission medications   Medication Sig Start Date End Date Taking? Authorizing Provider  diphenhydrAMINE (BENADRYL) 25 mg capsule Take 25 mg by mouth every 6 (six) hours as needed.    [provider]  dupilumab (DUPIXENT) 300 MG/2ML prefilled syringe Inject 300 mg into the skin every 14 (fourteen) days. Starting at day 15 for maintenance. 11/18/19   Deirdre Evener, MD  levocetirizine (XYZAL) 5 MG tablet Take 5 mg by mouth every evening.    [provider]  pregabalin (LYRICA) 100 MG capsule Take 100 mg by mouth 3 (three) times daily.    [provider]     Allergies Other  No family history on file.  Social History Social History   Tobacco Use  . Smoking status: Current Every Day Smoker    Packs/day: 0.20    Years: 5.00    Pack years: 1.00    Types: Cigars  . Smokeless tobacco: Never Used  . Tobacco comment: Smokes 2-3 cigars/day  Substance Use Topics  . Alcohol use: No  . Drug use: Not Currently    Types: Marijuana    Comment: smokes 2-3 day for 15 years  (04/21/19)    Review of Systems  Constitutional: Negative for fever, chills or body aches. ENT: Positive for sore throat.  Negative for runny nose, nasal congestion, ear pain, loss of taste/smell. Cardiovascular: Negative for chest pain or chest tightness. Respiratory: Negative for cough or shortness of breath. Gastrointestinal: Negative for abdominal pain, nausea vomiting or reflux. Skin: Negative for rash. Neurological: Negative for headaches, focal weakness or numbness. ____________________________________________  PHYSICAL EXAM:  VITAL SIGNS: ED Triage Vitals [04/26/20 1230]  Enc Vitals Group     BP (!) 129/95     Pulse Rate (!) 110     Resp 18     Temp 98.4 F (36.9 C)     Temp Source Oral     SpO2 99 %     Weight 130 lb 1.1 oz (59 kg)     Height      Head Circumference      Peak Flow  Pain Score      Pain Loc      Pain Edu?      Excl. in GC?     Constitutional: Alert and oriented. Well appearing and in no distress. Head: Normocephalic and atraumatic. Eyes: Normal extraocular movements Mouth/Throat: Mucous membranes are moist. + PND. No posterior pharynx erythema or exudate noted. Hematological/Lymphatic/Immunological: No cervical lymphadenopathy. Cardiovascular: Tachycardic, regular rhythm. Respiratory: Normal respiratory effort. No wheezes/rales/rhonchi. Gastrointestinal: Soft and nontender.  Neurologic:  Normal speech and language. No gross focal neurologic deficits are appreciated. Skin:  Skin is warm, dry and intact. No rash  noted.  ____________________________________________   LABS   Labs Reviewed  GROUP A STREP BY PCR    ____________________________________________   INITIAL IMPRESSION / ASSESSMENT AND PLAN / ED COURSE  Throat Swelling:  Pt is requesting a strep test Exam benign, afebrile She declines Covid test at this time Continue Zyrtec Decadron 10 mg IM today ____________________________________________  FINAL CLINICAL IMPRESSION(S) / ED DIAGNOSES  Final diagnoses:  Throat swelling      Lorre Munroe, NP 04/26/20 1430    Minna Antis, MD 04/26/20 1524

## 2020-04-26 NOTE — Discharge Instructions (Addendum)
You were seen today for throat swelling.  Your strep test is negative.  Continue antihistamine OTC for postnasal drip.  You received a steroid injection today.  Please follow-up with your PCP if symptoms persist or worsen.

## 2020-05-07 DIAGNOSIS — J3089 Other allergic rhinitis: Secondary | ICD-10-CM | POA: Diagnosis not present

## 2020-05-11 DIAGNOSIS — J3089 Other allergic rhinitis: Secondary | ICD-10-CM | POA: Diagnosis not present

## 2020-05-25 ENCOUNTER — Other Ambulatory Visit: Payer: Self-pay

## 2020-05-25 DIAGNOSIS — L2089 Other atopic dermatitis: Secondary | ICD-10-CM

## 2020-05-25 MED ORDER — DUPIXENT 300 MG/2ML ~~LOC~~ SOSY: 300.0000 mg | PREFILLED_SYRINGE | SUBCUTANEOUS | 5 refills | Status: DC

## 2020-05-25 NOTE — Progress Notes (Signed)
rx refill

## 2020-06-08 ENCOUNTER — Ambulatory Visit: Payer: Medicare HMO | Admitting: Dermatology

## 2020-06-17 DIAGNOSIS — J3089 Other allergic rhinitis: Secondary | ICD-10-CM | POA: Diagnosis not present

## 2020-06-23 DIAGNOSIS — Z0000 Encounter for general adult medical examination without abnormal findings: Secondary | ICD-10-CM | POA: Diagnosis not present

## 2020-06-23 DIAGNOSIS — K219 Gastro-esophageal reflux disease without esophagitis: Secondary | ICD-10-CM | POA: Diagnosis not present

## 2020-06-23 DIAGNOSIS — R0989 Other specified symptoms and signs involving the circulatory and respiratory systems: Secondary | ICD-10-CM | POA: Diagnosis not present

## 2020-06-23 DIAGNOSIS — D509 Iron deficiency anemia, unspecified: Secondary | ICD-10-CM | POA: Diagnosis not present

## 2020-06-29 DIAGNOSIS — J3089 Other allergic rhinitis: Secondary | ICD-10-CM | POA: Diagnosis not present

## 2020-07-07 DIAGNOSIS — M549 Dorsalgia, unspecified: Secondary | ICD-10-CM | POA: Diagnosis not present

## 2020-07-07 DIAGNOSIS — G600 Hereditary motor and sensory neuropathy: Secondary | ICD-10-CM | POA: Diagnosis not present

## 2020-07-07 DIAGNOSIS — R270 Ataxia, unspecified: Secondary | ICD-10-CM | POA: Diagnosis not present

## 2020-07-20 DIAGNOSIS — J3089 Other allergic rhinitis: Secondary | ICD-10-CM | POA: Diagnosis not present

## 2020-08-03 DIAGNOSIS — J3089 Other allergic rhinitis: Secondary | ICD-10-CM | POA: Diagnosis not present

## 2020-08-09 ENCOUNTER — Ambulatory Visit (INDEPENDENT_AMBULATORY_CARE_PROVIDER_SITE_OTHER): Payer: Medicare HMO | Admitting: Dermatology

## 2020-08-09 ENCOUNTER — Other Ambulatory Visit: Payer: Self-pay

## 2020-08-09 DIAGNOSIS — L2089 Other atopic dermatitis: Secondary | ICD-10-CM | POA: Diagnosis not present

## 2020-08-09 NOTE — Patient Instructions (Addendum)
Dupilumab (Dupixent) is a treatment given by injection for adults with moderate-to-severe atopic dermatitis. Goal is control of skin condition, not cure. It is given as 2 injections at the first dose followed by 1 injection ever 2 weeks thereafter.  Potential side effects include allergic reaction, herpes infections, injection site reactions and conjunctivitis (inflammation of the eyes).  The use of Dupixent requires long term medication management, including periodic office visits.  If you have any questions or concerns for your doctor, please call our main line at [phone] and press option 4 to reach your doctor's medical assistant. If no one answers, please leave a voicemail as directed and we will return your call as soon as possible. Messages left after 4 pm will be answered the following business day.   You may also send us a message via MyChart. We typically respond to MyChart messages within 1-2 business days.  For prescription refills, please ask your pharmacy to contact our office. Our fax number is [phone].  If you have an urgent issue when the clinic is closed that cannot wait until the next business day, you can page your doctor at the number below.    Please note that while we do our best to be available for urgent issues outside of office hours, we are not available 24/7.   If you have an urgent issue and are unable to reach us, you may choose to seek medical care at your doctor's office, retail clinic, urgent care center, or emergency room.  If you have a medical emergency, please immediately call 911 or go to the emergency department.  Pager Numbers  - Dr. Kowalski: [phone]  - Dr. Moye: [phone]  - Dr. Stewart: [phone]  In the event of inclement weather, please call our main line at [phone] for an update on the status of any delays or closures.  Dermatology Medication Tips: Please keep the boxes that topical medications come in in order to  help keep track of the instructions about where and how to use these. Pharmacies typically print the medication instructions only on the boxes and not directly on the medication tubes.   If your medication is too expensive, please contact our office at [phone] option 4 or send us a message through MyChart.   We are unable to tell what your co-pay for medications will be in advance as this is different depending on your insurance coverage. However, we may be able to find a substitute medication at lower cost or fill out paperwork to get insurance to cover a needed medication.   If a prior authorization is required to get your medication covered by your insurance company, please allow us 1-2 business days to complete this process.  Drug prices often vary depending on where the prescription is filled and some pharmacies may offer cheaper prices.  The website www.goodrx.com contains coupons for medications through different pharmacies. The prices here do not account for what the cost may be with help from insurance (it may be cheaper with your insurance), but the website can give you the price if you did not use any insurance.  - You can print the associated coupon and take it with your prescription to the pharmacy.  - You may also stop by our office during regular business hours and pick up a GoodRx coupon card.  - If you need your prescription sent electronically to a different pharmacy, notify our office through [place] MyChart or by phone at [phone] option 4.  

## 2020-08-09 NOTE — Progress Notes (Signed)
   Follow-Up Visit   Subjective  Kristine Campbell is a 40 y.o. female who presents for the following: Eczema (Patient here for 6 month follow-up Atopic Dermatitis. She is controlled on Dupixent injections every other week. She uses oral Benadryl at night when needed. No injection site reactions. No eye symptoms. Patient states skin is clear.). Patient has allergies and takes Xyzal.   The following portions of the chart were reviewed this encounter and updated as appropriate:       Review of Systems:  No other skin or systemic complaints except as noted in HPI or Assessment and Plan.  Objective  Well appearing patient in no apparent distress; mood and affect are within normal limits.  A focused examination was performed including face, trunk, extremities. Relevant physical exam findings are noted in the Assessment and Plan.  Objective  trunk, extremities: Hyperpigmented macules on bilateral arms and back. Still occasional itching on arms.   Assessment & Plan  Other atopic dermatitis trunk, extremities  Well-controlled on Dupixent. Still occasional itching  Atopic dermatitis - Severe, on Dupixent (biologic medication).  Atopic dermatitis (eczema) is a chronic, relapsing, pruritic condition that can significantly affect quality of life. It is often associated with allergic rhinitis and/or asthma and can require treatment with topical medications, phototherapy, or in severe cases a biologic medication called Dupixent.   Continue Dupixent injections q 2 weeks. Continue oral Benadryl qhs prn.  Recommend mild soap and moisturizing cream 1-2 times daily.  Gentle skin care handout provided.    Dupilumab (Dupixent) is a treatment given by injection for adults with moderate-to-severe atopic dermatitis. Goal is control of skin condition, not cure. It is given as 2 injections at the first dose followed by 1 injection ever 2 weeks thereafter.  Potential side effects include allergic reaction,  herpes infections, injection site reactions and conjunctivitis (inflammation of the eyes).  The use of Dupixent requires long term medication management, including periodic office visits.   Other Related Medications dupilumab (DUPIXENT) 300 MG/2ML prefilled syringe  Return in about 6 months (around 02/08/2021) for Atopic Dermatitis, Dupixent.   ICherlyn Labella, CMA, am acting as scribe for Willeen Niece, MD .  Documentation: I have reviewed the above documentation for accuracy and completeness, and I agree with the above.  Willeen Niece MD

## 2020-08-10 DIAGNOSIS — J3089 Other allergic rhinitis: Secondary | ICD-10-CM | POA: Diagnosis not present

## 2020-08-19 DIAGNOSIS — J3089 Other allergic rhinitis: Secondary | ICD-10-CM | POA: Diagnosis not present

## 2020-08-24 DIAGNOSIS — J3089 Other allergic rhinitis: Secondary | ICD-10-CM | POA: Diagnosis not present

## 2020-09-02 DIAGNOSIS — J3089 Other allergic rhinitis: Secondary | ICD-10-CM | POA: Diagnosis not present

## 2020-11-01 ENCOUNTER — Other Ambulatory Visit: Payer: Self-pay

## 2020-11-01 DIAGNOSIS — L2089 Other atopic dermatitis: Secondary | ICD-10-CM

## 2020-11-01 MED ORDER — DUPIXENT 300 MG/2ML ~~LOC~~ SOSY: 300.0000 mg | PREFILLED_SYRINGE | SUBCUTANEOUS | 5 refills | Status: DC

## 2021-02-15 ENCOUNTER — Ambulatory Visit: Payer: Medicare HMO | Admitting: Dermatology

## 2021-03-16 ENCOUNTER — Ambulatory Visit (INDEPENDENT_AMBULATORY_CARE_PROVIDER_SITE_OTHER): Payer: Commercial Managed Care - HMO | Admitting: Dermatology

## 2021-03-16 ENCOUNTER — Other Ambulatory Visit: Payer: Self-pay

## 2021-03-16 DIAGNOSIS — Z79899 Other long term (current) drug therapy: Secondary | ICD-10-CM | POA: Diagnosis not present

## 2021-03-16 DIAGNOSIS — L700 Acne vulgaris: Secondary | ICD-10-CM

## 2021-03-16 DIAGNOSIS — L2089 Other atopic dermatitis: Secondary | ICD-10-CM | POA: Diagnosis not present

## 2021-03-16 MED ORDER — MOMETASONE FUROATE 0.1 % EX CREA: TOPICAL_CREAM | CUTANEOUS | 3 refills | Status: DC

## 2021-03-16 MED ORDER — DUPIXENT 300 MG/2ML ~~LOC~~ SOSY: 300.0000 mg | PREFILLED_SYRINGE | SUBCUTANEOUS | 5 refills | Status: DC

## 2021-03-16 NOTE — Progress Notes (Signed)
Follow-Up Visit   Subjective  Kristine Campbell is a 41 y.o. female who presents for the following: Follow-up (Pt here atopic derm f/u. Treating with dupixent q2wks. Pt states that she has been doing well with the injection and denies any active areas. ). She also has a few outbreaks on the face, no treatment. No eye irritation from Dupixent.  No other side effects.    The following portions of the chart were reviewed this encounter and updated as appropriate:      Review of Systems: No other skin or systemic complaints except as noted in HPI or Assessment and Plan.   Objective  Well appearing patient in no apparent distress; mood and affect are within normal limits.  A focused examination was performed including face, trunk, extremities. Relevant physical exam findings are noted in the Assessment and Plan.  arms, legs, trunk Small scaly excoriated pink papules on the elbows; numerous small hyperpigmented papules on the arms; xerosis on the back  face Cystic papules x 3 of the left cheek   Assessment & Plan  Other atopic dermatitis arms, legs, trunk  Mild flare today, overall well-controlled on Dupixent  Atopic dermatitis - Severe, on Dupixent (biologic medication).  Atopic dermatitis (eczema) is a chronic, relapsing, pruritic condition that can significantly affect quality of life. It is often associated with allergic rhinitis and/or asthma and can require treatment with topical medications, phototherapy, or in severe cases a biologic medication called Dupixent.   Start mometasone cream Apply to affected areas rash qd/bid until improved dsp 50g 3Rf Continue Dupixent injections q 2 weeks 5Rf. Rx sent to [facility]. Continue oral Benadryl qhs prn.  Recommend mild soap and moisturizing cream 1-2 times daily. Gentle skin care handout provided. Sample of Aveeno Skin Balm - recommend daily use after shower.    Dupilumab (Dupixent) is a treatment given by injection for adults and  children with moderate-to-severe atopic dermatitis. Goal is control of skin condition, not cure. It is given as 2 injections at the first dose followed by 1 injection ever 2 weeks thereafter.  Young children are dosed monthly.  Potential side effects include allergic reaction, herpes infections, injection site reactions and conjunctivitis (inflammation of the eyes).  The use of Dupixent requires long term medication management, including periodic office visits.   mometasone (ELOCON) 0.1 % cream - arms, legs, trunk Apply to affected areas rash once to twice daily until improved.  Related Medications dupilumab (DUPIXENT) 300 MG/2ML prefilled syringe Inject 300 mg into the skin every 14 (fourteen) days. Starting at day 15 for maintenance.  Acne vulgaris face  Start [place]-Posay adapalene 0.1% gel qhs for acne - sample given.  Topical retinoid medications like tretinoin/Retin-A, adapalene/Differin, tazarotene/Fabior, and Epiduo/Epiduo Forte can cause dryness and irritation when first started. Only apply a pea-sized amount to the entire affected area. Avoid applying it around the eyes, edges of mouth and creases at the nose. If you experience irritation, use a good moisturizer first and/or apply the medicine less often. If you are doing well with the medicine, you can increase how often you use it until you are applying every night. Be careful with sun protection while using this medication as it can make you sensitive to the sun. This medicine should not be used by pregnant women.   Neutrogena Spot Acne Treatment (BPO) prn flares, sample given.   Benzoyl peroxide can cause dryness and irritation of the skin. It can also bleach fabric. When used together with Aczone (dapsone) cream, it can  stain the skin orange.  If worsens or not improving, will send in prescription treatment.    Return in about 6 months (around 09/13/2021) for Atopic Dermatitis.  ICherlyn Labella, CMA, am acting as scribe for  Willeen Niece, MD.  Documentation: I have reviewed the above documentation for accuracy and completeness, and I agree with the above.  Willeen Niece MD

## 2021-03-16 NOTE — Patient Instructions (Addendum)
Atopic dermatitis - Severe, on Dupixent (biologic medication).  Atopic dermatitis (eczema) is a chronic, relapsing, pruritic condition that can significantly affect quality of life. It is often associated with allergic rhinitis and/or asthma and can require treatment with topical medications, phototherapy, or in severe cases a biologic medication called Dupixent.   Dupilumab (Dupixent) is a treatment given by injection for adults and children with moderate-to-severe atopic dermatitis. Goal is control of skin condition, not cure. It is given as 2 injections at the first dose followed by 1 injection ever 2 weeks thereafter.  Young children are dosed monthly.  Potential side effects include allergic reaction, herpes infections, injection site reactions and conjunctivitis (inflammation of the eyes).  The use of Dupixent requires long term medication management, including periodic office visits.  If You Need Anything After Your Visit  If you have any questions or concerns for your doctor, please call our main line at [phone] and press option 4 to reach your doctor's medical assistant. If no one answers, please leave a voicemail as directed and we will return your call as soon as possible. Messages left after 4 pm will be answered the following business day.   You may also send [place] a message via [place]. We typically respond to MyChart messages within 1-2 business days.  For prescription refills, please ask your pharmacy to contact our office. Our fax number is [phone].  If you have an urgent issue when the clinic is closed that cannot wait until the next business day, you can page your doctor at the number below.    Please note that while we do our best to be available for urgent issues outside of office hours, we are not available 24/7.   If you have an urgent issue and are unable to reach [place], you may choose to seek medical care at your doctor's office, retail clinic, urgent care center, or  emergency room.  If you have a medical emergency, please immediately call 911 or go to the emergency department.  Pager Numbers  - Dr. Nehemiah Massed: [phone]  - Dr. Laurence Ferrari: [phone]  - Dr. Nicole Kindred: [phone]  In the event of inclement weather, please call our main line at [phone] for an update on the status of any delays or closures.  Dermatology Medication Tips: Please keep the boxes that topical medications come in in order to help keep track of the instructions about where and how to use these. Pharmacies typically print the medication instructions only on the boxes and not directly on the medication tubes.   If your medication is too expensive, please contact our office at [phone] option 4 or send [place] a message through [place].   We are unable to tell what your co-pay for medications will be in advance as this is different depending on your insurance coverage. However, we may be able to find a substitute medication at lower cost or fill out paperwork to get insurance to cover a needed medication.   If a prior authorization is required to get your medication covered by your insurance company, please allow [place] 1-2 business days to complete this process.  Drug prices often vary depending on where the prescription is filled and some pharmacies may offer cheaper prices.  The website www.goodrx.com contains coupons for medications through different pharmacies. The prices here do not account for what the cost may be with help from insurance (it may be cheaper with your insurance), but the website can give you the price if you did not use  any insurance.  - You can print the associated coupon and take it with your prescription to the pharmacy.  - You may also stop by our office during regular business hours and pick up a GoodRx coupon card.  - If you need your prescription sent electronically to a different pharmacy, notify our office through [facility] or by phone at  [phone] option 4.     Si Usted Necesita Algo Despus de Su Visita  Tambin puede enviarnos un mensaje a travs de Pharmacist, community. Por lo general respondemos a los mensajes de MyChart en el transcurso de 1 a 2 das hbiles.  Para renovar recetas, por favor pida a su farmacia que se ponga en contacto con nuestra oficina. Harland Dingwall de fax es [place] [phone].  Si tiene un asunto urgente cuando la clnica est cerrada y que no puede esperar hasta el siguiente da hbil, puede llamar/localizar a su doctor(a) al nmero que aparece a continuacin.   Por favor, tenga en cuenta que aunque hacemos todo lo posible para estar disponibles para asuntos urgentes fuera del horario de [place], no estamos disponibles las 24 horas del da, los 7 das de la [place].   Si tiene un problema urgente y no puede comunicarse con nosotros, puede optar por buscar atencin mdica  en el consultorio de su doctor(a), en una clnica privada, en un centro de atencin urgente o en una sala de emergencias.  Si tiene Engineering geologist, por favor llame inmediatamente al 911 o vaya a la sala de emergencias.  Nmeros de bper  - Dr. Nehemiah Massed: [phone]  - Dra. Moye: [phone]  - Dra. Nicole Kindred: [phone]  En caso de inclemencias del [place], por favor llame a Johnsie Kindred principal al [phone] para una actualizacin sobre el [place] de cualquier retraso o cierre.  Consejos para la medicacin en dermatologa: Por favor, guarde las cajas en las que vienen los medicamentos de uso tpico para ayudarle a seguir las instrucciones sobre dnde y cmo usarlos. Las farmacias generalmente imprimen las instrucciones del medicamento slo en las cajas y no directamente en los tubos del [place].   Si su medicamento es muy caro, por favor, pngase en contacto con Zigmund Daniel llamando al [phone] y presione la opcin 4 o envenos un mensaje a travs de Pharmacist, community.   No podemos decirle cul ser su copago por los  medicamentos por adelantado ya que esto es diferente dependiendo de la cobertura de su seguro. Sin embargo, es posible que podamos encontrar un medicamento sustituto a Electrical engineer un formulario para que el seguro cubra el medicamento que se considera necesario.   Si se requiere una autorizacin previa para que su compaa de seguros [place] su medicamento, por favor permtanos de 1 a 2 das hbiles para completar este proceso.  Los precios de los medicamentos varan con frecuencia dependiendo del Environmental consultant de dnde se surte la receta y alguna farmacias pueden ofrecer precios ms baratos.  El sitio web www.goodrx.com tiene cupones para medicamentos de Airline pilot. Los precios aqu no tienen en cuenta lo que podra costar con la ayuda del seguro (puede ser ms barato con su seguro), pero el sitio web puede darle el precio si no utiliz Research scientist (physical sciences).  - Puede imprimir el cupn correspondiente y llevarlo con su receta a la farmacia.  - Tambin puede pasar por nuestra oficina durante el horario de atencin regular y Charity fundraiser una tarjeta de cupones de GoodRx.  - Si necesita que su receta se enve electrnicamente a  una farmacia diferente, informe a nuestra oficina a travs de MyChart de [place] o por telfono llamando al [phone] y presione la opcin 4.

## 2021-08-30 ENCOUNTER — Other Ambulatory Visit: Payer: Self-pay | Admitting: Infectious Diseases

## 2021-08-30 DIAGNOSIS — Z1231 Encounter for screening mammogram for malignant neoplasm of breast: Secondary | ICD-10-CM

## 2021-09-22 ENCOUNTER — Ambulatory Visit
Admission: RE | Admit: 2021-09-22 | Discharge: 2021-09-22 | Disposition: A | Discharge status: Home / Self Care | Payer: Medicare Other | Source: Ambulatory Visit | Attending: Infectious Diseases | Admitting: Infectious Diseases

## 2021-09-22 DIAGNOSIS — Z1231 Encounter for screening mammogram for malignant neoplasm of breast: Secondary | ICD-10-CM | POA: Diagnosis not present

## 2021-09-26 ENCOUNTER — Ambulatory Visit: Payer: Commercial Managed Care - HMO | Admitting: Dermatology

## 2021-10-12 ENCOUNTER — Ambulatory Visit (INDEPENDENT_AMBULATORY_CARE_PROVIDER_SITE_OTHER): Payer: Medicare Other | Admitting: Dermatology

## 2021-10-12 ENCOUNTER — Encounter: Payer: Self-pay | Admitting: Dermatology

## 2021-10-12 DIAGNOSIS — L2089 Other atopic dermatitis: Secondary | ICD-10-CM | POA: Diagnosis not present

## 2021-10-12 DIAGNOSIS — Z79899 Other long term (current) drug therapy: Secondary | ICD-10-CM | POA: Diagnosis not present

## 2021-10-12 MED ORDER — DUPIXENT 300 MG/2ML ~~LOC~~ SOSY: 300.0000 mg | PREFILLED_SYRINGE | SUBCUTANEOUS | 5 refills | Status: DC

## 2021-10-12 NOTE — Progress Notes (Signed)
   Follow-Up Visit   Subjective  Kristine Campbell is a 41 y.o. female who presents for the following: Eczema (6 month atopic derm f/u. Treating with dupixent q2wks. Pt states that she has been doing well with the injection and denies any active areas. Denies eye irritation. No adverse reactions or injection site reactions).  Not using any topical Rx creams.  Not itching.    The following portions of the chart were reviewed this encounter and updated as appropriate:      Review of Systems: No other skin or systemic complaints except as noted in HPI or Assessment and Plan.   Objective  Well appearing patient in no apparent distress; mood and affect are within normal limits.  A focused examination was performed including arms, legs. Relevant physical exam findings are noted in the Assessment and Plan.  arms, legs, torso Hyperpigmented macules, mild xerosis on back, arms   Assessment & Plan  Other atopic dermatitis arms, legs, torso  Chronic condition with duration or expected duration over one year. Currently well-controlled on Dupixent.  Atopic dermatitis - Severe, on Dupixent (biologic medication).  Atopic dermatitis (eczema) is a chronic, relapsing, pruritic condition that can significantly affect quality of life. It is often associated with allergic rhinitis and/or asthma and can require treatment with topical medications, phototherapy, or in severe cases a biologic medication called Dupixent, which requires long term medication management.     Use mometasone cream Apply to affected areas rash qd/bid until improved dsp 50g 3Rf Continue Dupixent injections q 2 weeks 5Rf. Rx sent to [facility]. Continue oral Benadryl qhs prn.  Recommend mild soap and moisturizing cream 1-2 times daily.   Dupilumab (Dupixent) is a treatment given by injection for adults and children with moderate-to-severe atopic dermatitis. Goal is control of skin condition, not cure. It is given as 2 injections at  the first dose followed by 1 injection ever 2 weeks thereafter.  Young children are dosed monthly.  Potential side effects include allergic reaction, herpes infections, injection site reactions and conjunctivitis (inflammation of the eyes).  The use of Dupixent requires long term medication management, including periodic office visits.   Related Medications mometasone (ELOCON) 0.1 % cream Apply to affected areas rash once to twice daily until improved.  dupilumab (DUPIXENT) 300 MG/2ML prefilled syringe Inject 300 mg into the skin every 14 (fourteen) days. Starting at day 15 for maintenance.   Return in about 6 months (around 04/14/2022) for Atopic Dermatitis, Dupixent Follow Up.  I, Lawson Radar, CMA, am acting as scribe for Willeen Niece, MD.  Documentation: I have reviewed the above documentation for accuracy and completeness, and I agree with the above.  Willeen Niece MD

## 2021-10-12 NOTE — Patient Instructions (Signed)
Use mometasone cream Apply to affected areas rash qd/bid as needed for flares.  Continue Dupixent injections q 2 weeks 5Rf. Rx sent to [facility]. Continue oral Benadryl qhs prn.  Recommend mild soap and moisturizing cream 1-2 times daily.   Dupilumab (Dupixent) is a treatment given by injection for adults and children with moderate-to-severe atopic dermatitis. Goal is control of skin condition, not cure. It is given as 2 injections at the first dose followed by 1 injection ever 2 weeks thereafter.  Young children are dosed monthly.  Potential side effects include allergic reaction, herpes infections, injection site reactions and conjunctivitis (inflammation of the eyes).  The use of Dupixent requires long term medication management, including periodic office visits.   Gentle Skin Care Guide  1. Bathe no more than once a day.  2. Avoid bathing in hot water  3. Use a mild soap like Dove, Vanicream, Cetaphil, CeraVe. Can use Lever 2000 or Cetaphil antibacterial soap  4. Use soap only where you need it. On most days, use it under your arms, between your legs, and on your feet. Let the water rinse other areas unless visibly dirty.  5. When you get out of the bath/shower, use a towel to gently blot your skin dry, don't rub it.  6. While your skin is still a little damp, apply a moisturizing cream such as Vanicream, CeraVe, Cetaphil, Eucerin, Sarna lotion or plain Vaseline Jelly. For hands apply Neutrogena [place] Hand Cream or Excipial Hand Cream.  7. Reapply moisturizer any time you start to itch or feel dry.  8. Sometimes using free and clear laundry detergents can be helpful. Fabric softener sheets should be avoided. Downy Free & Gentle liquid, or any liquid fabric softener that is free of dyes and perfumes, it acceptable to use  9. If your doctor has given you prescription creams you may apply moisturizers over them      Due to recent changes in healthcare laws, you may see results of  your pathology and/or laboratory studies on MyChart before the doctors have had a chance to review them. We understand that in some cases there may be results that are confusing or concerning to you. Please understand that not all results are received at the same time and often the doctors may need to interpret multiple results in order to provide you with the best plan of care or course of treatment. Therefore, we ask that you please give [place] 2 business days to thoroughly review all your results before contacting the office for clarification. Should we see a critical lab result, you will be contacted sooner.   If You Need Anything After Your Visit  If you have any questions or concerns for your doctor, please call our main line at [phone] and press option 4 to reach your doctor's medical assistant. If no one answers, please leave a voicemail as directed and we will return your call as soon as possible. Messages left after 4 pm will be answered the following business day.   You may also send [place] a message via MyChart. We typically respond to MyChart messages within 1-2 business days.  For prescription refills, please ask your pharmacy to contact our office. Our fax number is [phone].  If you have an urgent issue when the clinic is closed that cannot wait until the next business day, you can page your doctor at the number below.    Please note that while we do our best to be available for urgent issues outside  of office hours, we are not available 24/7.   If you have an urgent issue and are unable to reach [place], you may choose to seek medical care at your doctor's office, retail clinic, urgent care center, or emergency room.  If you have a medical emergency, please immediately call 911 or go to the emergency department.  Pager Numbers  - Dr. Gwen Pounds: [phone]  - Dr. Neale Burly: [phone]  - Dr. Roseanne Reno: [phone]  In the event of inclement weather, please call our main line at  [phone] for an update on the status of any delays or closures.  Dermatology Medication Tips: Please keep the boxes that topical medications come in in order to help keep track of the instructions about where and how to use these. Pharmacies typically print the medication instructions only on the boxes and not directly on the medication tubes.   If your medication is too expensive, please contact our office at [phone] option 4 or send [place] a message through MyChart.   We are unable to tell what your co-pay for medications will be in advance as this is different depending on your insurance coverage. However, we may be able to find a substitute medication at lower cost or fill out paperwork to get insurance to cover a needed medication.   If a prior authorization is required to get your medication covered by your insurance company, please allow [place] 1-2 business days to complete this process.  Drug prices often vary depending on where the prescription is filled and some pharmacies may offer cheaper prices.  The website www.goodrx.com contains coupons for medications through different pharmacies. The prices here do not account for what the cost may be with help from insurance (it may be cheaper with your insurance), but the website can give you the price if you did not use any insurance.  - You can print the associated coupon and take it with your prescription to the pharmacy.  - You may also stop by our office during regular business hours and pick up a GoodRx coupon card.  - If you need your prescription sent electronically to a different pharmacy, notify our office through [facility] or by phone at [phone] option 4.     Si Usted Necesita Algo Despus de Su Visita  Tambin puede enviarnos un mensaje a travs de Clinical cytogeneticist. Por lo general respondemos a los mensajes de MyChart en el transcurso de 1 a 2 das hbiles.  Para renovar recetas, por favor pida a su farmacia que se  ponga en contacto con nuestra oficina. Annie Sable de fax es [place] [phone].  Si tiene un asunto urgente cuando la clnica est cerrada y que no puede esperar hasta el siguiente da hbil, puede llamar/localizar a su doctor(a) al nmero que aparece a continuacin.   Por favor, tenga en cuenta que aunque hacemos todo lo posible para estar disponibles para asuntos urgentes fuera del horario de [place], no estamos disponibles las 24 horas del da, los 7 [address]  [address] de la [place].   Si tiene un problema urgente y no puede comunicarse con nosotros, puede optar por buscar atencin mdica  en el consultorio de su doctor(a), en una clnica privada, en un centro de atencin urgente o en una sala de emergencias.  Si tiene Engineer, drilling, por favor llame inmediatamente al 911 o vaya a la sala de emergencias.  Nmeros de bper  - Dr. Gwen Pounds: [phone]  - Dra. Moye: [phone]  - Dra. Roseanne Reno: [phone]  En caso de  inclemencias del [place], por favor llame a nuestra lnea principal al [phone] para una actualizacin sobre el [place] de cualquier retraso o cierre.  Consejos para la medicacin en dermatologa: Por favor, guarde las cajas en las que vienen los medicamentos de uso tpico para ayudarle a seguir las instrucciones sobre dnde y cmo usarlos. Las farmacias generalmente imprimen las instrucciones del medicamento slo en las cajas y no directamente en los tubos del [place].   Si su medicamento es muy caro, por favor, pngase en contacto con Rolm [place] llamando al [phone] y presione la opcin 4 o envenos un mensaje a travs de Clinical cytogeneticist.   No podemos decirle cul ser su copago por los medicamentos por adelantado ya que esto es diferente dependiendo de la cobertura de su seguro. Sin embargo, es posible que podamos encontrar un medicamento sustituto a Audiological scientist un formulario para que el seguro cubra el medicamento que se considera necesario.   Si se requiere  una autorizacin previa para que su compaa de seguros [place] su medicamento, por favor permtanos de 1 a 2 das hbiles para completar [address]  Los precios de los medicamentos varan con frecuencia dependiendo del Environmental consultant de dnde se surte la receta y alguna farmacias pueden ofrecer precios ms baratos.  El sitio web www.goodrx.com tiene cupones para medicamentos de Health and safety inspector. Los precios aqu no tienen en cuenta lo que podra costar con la ayuda del seguro (puede ser ms barato con su seguro), pero el sitio web puede darle el precio si no utiliz Tourist information centre manager.  - Puede imprimir el cupn correspondiente y llevarlo con su receta a la farmacia.  - Tambin puede pasar por nuestra oficina durante el horario de atencin regular y Education officer, museum una tarjeta de cupones de GoodRx.  - Si necesita que su receta se enve electrnicamente a una farmacia diferente, informe a nuestra oficina a travs de MyChart de  o por telfono llamando al [phone] y presione la opcin 4.

## 2021-11-15 DIAGNOSIS — J3089 Other allergic rhinitis: Secondary | ICD-10-CM | POA: Diagnosis not present

## 2021-12-02 DIAGNOSIS — J3089 Other allergic rhinitis: Secondary | ICD-10-CM | POA: Diagnosis not present

## 2021-12-06 DIAGNOSIS — J3089 Other allergic rhinitis: Secondary | ICD-10-CM | POA: Diagnosis not present

## 2021-12-22 DIAGNOSIS — J3089 Other allergic rhinitis: Secondary | ICD-10-CM | POA: Diagnosis not present

## 2021-12-22 DIAGNOSIS — J3081 Allergic rhinitis due to animal (cat) (dog) hair and dander: Secondary | ICD-10-CM | POA: Diagnosis not present

## 2021-12-22 DIAGNOSIS — J301 Allergic rhinitis due to pollen: Secondary | ICD-10-CM | POA: Diagnosis not present

## 2022-01-04 DIAGNOSIS — J3089 Other allergic rhinitis: Secondary | ICD-10-CM | POA: Diagnosis not present

## 2022-01-05 DIAGNOSIS — K219 Gastro-esophageal reflux disease without esophagitis: Secondary | ICD-10-CM | POA: Diagnosis not present

## 2022-01-05 DIAGNOSIS — J3089 Other allergic rhinitis: Secondary | ICD-10-CM | POA: Diagnosis not present

## 2022-01-05 DIAGNOSIS — Z79899 Other long term (current) drug therapy: Secondary | ICD-10-CM | POA: Diagnosis not present

## 2022-01-05 DIAGNOSIS — J45909 Unspecified asthma, uncomplicated: Secondary | ICD-10-CM | POA: Diagnosis not present

## 2022-01-05 DIAGNOSIS — R030 Elevated blood-pressure reading, without diagnosis of hypertension: Secondary | ICD-10-CM | POA: Diagnosis not present

## 2022-01-05 DIAGNOSIS — F17200 Nicotine dependence, unspecified, uncomplicated: Secondary | ICD-10-CM | POA: Diagnosis not present

## 2022-01-05 DIAGNOSIS — Z885 Allergy status to narcotic agent status: Secondary | ICD-10-CM | POA: Diagnosis not present

## 2022-01-05 DIAGNOSIS — G629 Polyneuropathy, unspecified: Secondary | ICD-10-CM | POA: Diagnosis not present

## 2022-01-05 DIAGNOSIS — Z833 Family history of diabetes mellitus: Secondary | ICD-10-CM | POA: Diagnosis not present

## 2022-01-13 DIAGNOSIS — J3089 Other allergic rhinitis: Secondary | ICD-10-CM | POA: Diagnosis not present

## 2022-02-02 DIAGNOSIS — J3089 Other allergic rhinitis: Secondary | ICD-10-CM | POA: Diagnosis not present

## 2022-02-14 DIAGNOSIS — J3089 Other allergic rhinitis: Secondary | ICD-10-CM | POA: Diagnosis not present

## 2022-02-16 DIAGNOSIS — K219 Gastro-esophageal reflux disease without esophagitis: Secondary | ICD-10-CM | POA: Diagnosis not present

## 2022-02-16 DIAGNOSIS — R000 Tachycardia, unspecified: Secondary | ICD-10-CM | POA: Diagnosis not present

## 2022-02-16 DIAGNOSIS — G600 Hereditary motor and sensory neuropathy: Secondary | ICD-10-CM | POA: Diagnosis not present

## 2022-02-16 DIAGNOSIS — E611 Iron deficiency: Secondary | ICD-10-CM | POA: Diagnosis not present

## 2022-02-23 DIAGNOSIS — J3089 Other allergic rhinitis: Secondary | ICD-10-CM | POA: Diagnosis not present

## 2022-03-02 DIAGNOSIS — J3081 Allergic rhinitis due to animal (cat) (dog) hair and dander: Secondary | ICD-10-CM | POA: Diagnosis not present

## 2022-03-02 DIAGNOSIS — J3089 Other allergic rhinitis: Secondary | ICD-10-CM | POA: Diagnosis not present

## 2022-03-02 DIAGNOSIS — J301 Allergic rhinitis due to pollen: Secondary | ICD-10-CM | POA: Diagnosis not present

## 2022-03-09 DIAGNOSIS — J3089 Other allergic rhinitis: Secondary | ICD-10-CM | POA: Diagnosis not present

## 2022-03-20 DIAGNOSIS — G629 Polyneuropathy, unspecified: Secondary | ICD-10-CM | POA: Diagnosis not present

## 2022-03-20 DIAGNOSIS — R000 Tachycardia, unspecified: Secondary | ICD-10-CM | POA: Diagnosis not present

## 2022-03-20 DIAGNOSIS — D509 Iron deficiency anemia, unspecified: Secondary | ICD-10-CM | POA: Diagnosis not present

## 2022-03-20 DIAGNOSIS — M2560 Stiffness of unspecified joint, not elsewhere classified: Secondary | ICD-10-CM | POA: Diagnosis not present

## 2022-03-20 DIAGNOSIS — K219 Gastro-esophageal reflux disease without esophagitis: Secondary | ICD-10-CM | POA: Diagnosis not present

## 2022-03-20 DIAGNOSIS — F17209 Nicotine dependence, unspecified, with unspecified nicotine-induced disorders: Secondary | ICD-10-CM | POA: Diagnosis not present

## 2022-03-20 DIAGNOSIS — R52 Pain, unspecified: Secondary | ICD-10-CM | POA: Diagnosis not present

## 2022-03-20 DIAGNOSIS — R270 Ataxia, unspecified: Secondary | ICD-10-CM | POA: Diagnosis not present

## 2022-03-20 DIAGNOSIS — G600 Hereditary motor and sensory neuropathy: Secondary | ICD-10-CM | POA: Diagnosis not present

## 2022-03-21 ENCOUNTER — Other Ambulatory Visit: Payer: Self-pay

## 2022-03-21 DIAGNOSIS — L2089 Other atopic dermatitis: Secondary | ICD-10-CM

## 2022-03-21 MED ORDER — DUPIXENT 300 MG/2ML ~~LOC~~ SOSY: 300.0000 mg | PREFILLED_SYRINGE | SUBCUTANEOUS | 0 refills | Status: DC

## 2022-03-31 DIAGNOSIS — J3089 Other allergic rhinitis: Secondary | ICD-10-CM | POA: Diagnosis not present

## 2022-04-06 DIAGNOSIS — J3089 Other allergic rhinitis: Secondary | ICD-10-CM | POA: Diagnosis not present

## 2022-04-17 ENCOUNTER — Other Ambulatory Visit: Payer: Self-pay

## 2022-04-17 DIAGNOSIS — L2089 Other atopic dermatitis: Secondary | ICD-10-CM

## 2022-04-17 MED ORDER — DUPIXENT 300 MG/2ML ~~LOC~~ SOSY: 300.0000 mg | PREFILLED_SYRINGE | SUBCUTANEOUS | 5 refills | Status: DC

## 2022-04-17 NOTE — Progress Notes (Signed)
Fax received from [facility] for prescription refill request for Dupixent 35m/2mL SQ QOW.

## 2022-04-18 ENCOUNTER — Ambulatory Visit (INDEPENDENT_AMBULATORY_CARE_PROVIDER_SITE_OTHER): Payer: Medicare HMO | Admitting: Dermatology

## 2022-04-18 VITALS — BP 115/77 | HR 113

## 2022-04-18 DIAGNOSIS — J301 Allergic rhinitis due to pollen: Secondary | ICD-10-CM | POA: Diagnosis not present

## 2022-04-18 DIAGNOSIS — Z79899 Other long term (current) drug therapy: Secondary | ICD-10-CM

## 2022-04-18 DIAGNOSIS — L209 Atopic dermatitis, unspecified: Secondary | ICD-10-CM

## 2022-04-18 DIAGNOSIS — J3081 Allergic rhinitis due to animal (cat) (dog) hair and dander: Secondary | ICD-10-CM | POA: Diagnosis not present

## 2022-04-18 DIAGNOSIS — J3089 Other allergic rhinitis: Secondary | ICD-10-CM | POA: Diagnosis not present

## 2022-04-18 NOTE — Progress Notes (Signed)
   Follow-Up Visit   Subjective  Kristine Campbell is a 42 y.o. female who presents for the following: Atopic dermatitis (Doing well with Dupixent 300 mg/16mL SQ QOW and Mometasone 0.1% cream QD PRN).  No side effects from [place].   The following portions of the chart were reviewed this encounter and updated as appropriate:       Review of Systems:  No other skin or systemic complaints except as noted in HPI or Assessment and Plan.  Objective  Well appearing patient in no apparent distress; mood and affect are within normal limits.  A focused examination was performed including the face and extremities. Relevant physical exam findings are noted in the Assessment and Plan.  Trunk, extremities Follicular hyperpigmentation on the arms, back, and flanks. No active areas    Assessment & Plan  Atopic dermatitis, unspecified type Trunk, extremities  Chronic condition with duration or expected duration over one year. Currently well-controlled.   Atopic dermatitis - Severe, on Dupixent (biologic medication).  Atopic dermatitis (eczema) is a chronic, relapsing, pruritic condition that can significantly affect quality of life. It is often associated with allergic rhinitis and/or asthma and can require treatment with topical medications, phototherapy, or in severe cases biologic medications, which require long term medication management.    Continue Dupixent 300 mg/52mL SQ QOW. Dupilumab (Dupixent) is a treatment given by injection for adults and children with moderate-to-severe atopic dermatitis. Goal is control of skin condition, not cure. It is given as 2 injections at the first dose followed by 1 injection ever 2 weeks thereafter.  Young children are dosed monthly.  Potential side effects include allergic reaction, herpes infections, injection site reactions and conjunctivitis (inflammation of the eyes).  The use of Dupixent requires long term medication management, including periodic office  visits.  Continue Mometasone 0.1% cream to aa's QD-BID PRN.  Topical steroids (such as triamcinolone, fluocinolone, fluocinonide, mometasone, clobetasol, halobetasol, betamethasone, hydrocortisone) can cause thinning and lightening of the skin if they are used for too long in the same area. Your physician has selected the right strength medicine for your problem and area affected on the body. Please use your medication only as directed by your physician to prevent side effects.     Return in about 6 months (around 10/17/2022) for atopic dermatitis follow up .  Luther Redo, CMA, am acting as scribe for Brendolyn Patty, MD .  Documentation: I have reviewed the above documentation for accuracy and completeness, and I agree with the above.  Brendolyn Patty MD

## 2022-04-18 NOTE — Patient Instructions (Signed)
Due to recent changes in healthcare laws, you may see results of your pathology and/or laboratory studies on MyChart before the doctors have had a chance to review them. We understand that in some cases there may be results that are confusing or concerning to you. Please understand that not all results are received at the same time and often the doctors may need to interpret multiple results in order to provide you with the best plan of care or course of treatment. Therefore, we ask that you please give us 2 business days to thoroughly review all your results before contacting the office for clarification. Should we see a critical lab result, you will be contacted sooner.   If You Need Anything After Your Visit  If you have any questions or concerns for your doctor, please call our main line at [phone] and press option 4 to reach your doctor's medical assistant. If no one answers, please leave a voicemail as directed and we will return your call as soon as possible. Messages left after 4 pm will be answered the following business day.   You may also send us a message via MyChart. We typically respond to MyChart messages within 1-2 business days.  For prescription refills, please ask your pharmacy to contact our office. Our fax number is [phone].  If you have an urgent issue when the clinic is closed that cannot wait until the next business day, you can page your doctor at the number below.    Please note that while we do our best to be available for urgent issues outside of office hours, we are not available 24/7.   If you have an urgent issue and are unable to reach us, you may choose to seek medical care at your doctor's office, retail clinic, urgent care center, or emergency room.  If you have a medical emergency, please immediately call 911 or go to the emergency department.  Pager Numbers  - Dr. Kowalski: [phone]  - Dr. Moye: [phone]  - Dr. Stewart:  [phone]  In the event of inclement weather, please call our main line at [phone] for an update on the status of any delays or closures.  Dermatology Medication Tips: Please keep the boxes that topical medications come in in order to help keep track of the instructions about where and how to use these. Pharmacies typically print the medication instructions only on the boxes and not directly on the medication tubes.   If your medication is too expensive, please contact our office at [phone] option 4 or send us a message through MyChart.   We are unable to tell what your co-pay for medications will be in advance as this is different depending on your insurance coverage. However, we may be able to find a substitute medication at lower cost or fill out paperwork to get insurance to cover a needed medication.   If a prior authorization is required to get your medication covered by your insurance company, please allow us 1-2 business days to complete this process.  Drug prices often vary depending on where the prescription is filled and some pharmacies may offer cheaper prices.  The website www.goodrx.com contains coupons for medications through different pharmacies. The prices here do not account for what the cost may be with help from insurance (it may be cheaper with your insurance), but the website can give you the price if you did not use any insurance.  - You can print the associated coupon and take it with   your prescription to the pharmacy.  - You may also stop by our office during regular business hours and pick up a GoodRx coupon card.  - If you need your prescription sent electronically to a different pharmacy, notify our office through [place] MyChart or by phone at [phone] option 4.     Si Usted Necesita Algo Despus de Su Visita  Tambin puede enviarnos un mensaje a travs de MyChart. Por lo general respondemos a los mensajes de MyChart en el transcurso de 1 a 2  das hbiles.  Para renovar recetas, por favor pida a su farmacia que se ponga en contacto con nuestra oficina. Nuestro nmero de fax es el [phone].  Si tiene un asunto urgente cuando la clnica est cerrada y que no puede esperar hasta el siguiente da hbil, puede llamar/localizar a su doctor(a) al nmero que aparece a continuacin.   Por favor, tenga en cuenta que aunque hacemos todo lo posible para estar disponibles para asuntos urgentes fuera del horario de oficina, no estamos disponibles las 24 horas del da, los 7 das de la semana.   Si tiene un problema urgente y no puede comunicarse con nosotros, puede optar por buscar atencin mdica  en el consultorio de su doctor(a), en una clnica privada, en un centro de atencin urgente o en una sala de emergencias.  Si tiene una emergencia mdica, por favor llame inmediatamente al 911 o vaya a la sala de emergencias.  Nmeros de bper  - Dr. Kowalski: [phone]  - Dra. Moye: [phone]  - Dra. Stewart: [phone]  En caso de inclemencias del tiempo, por favor llame a nuestra lnea principal al [phone] para una actualizacin sobre el estado de cualquier retraso o cierre.  Consejos para la medicacin en dermatologa: Por favor, guarde las cajas en las que vienen los medicamentos de uso tpico para ayudarle a seguir las instrucciones sobre dnde y cmo usarlos. Las farmacias generalmente imprimen las instrucciones del medicamento slo en las cajas y no directamente en los tubos del medicamento.   Si su medicamento es muy caro, por favor, pngase en contacto con nuestra oficina llamando al [phone] y presione la opcin 4 o envenos un mensaje a travs de MyChart.   No podemos decirle cul ser su copago por los medicamentos por adelantado ya que esto es diferente dependiendo de la cobertura de su seguro. Sin embargo, es posible que podamos encontrar un medicamento sustituto a menor costo o llenar un formulario para que el  seguro cubra el medicamento que se considera necesario.   Si se requiere una autorizacin previa para que su compaa de seguros cubra su medicamento, por favor permtanos de 1 a 2 das hbiles para completar este proceso.  Los precios de los medicamentos varan con frecuencia dependiendo del lugar de dnde se surte la receta y alguna farmacias pueden ofrecer precios ms baratos.  El sitio web www.goodrx.com tiene cupones para medicamentos de diferentes farmacias. Los precios aqu no tienen en cuenta lo que podra costar con la ayuda del seguro (puede ser ms barato con su seguro), pero el sitio web puede darle el precio si no utiliz ningn seguro.  - Puede imprimir el cupn correspondiente y llevarlo con su receta a la farmacia.  - Tambin puede pasar por nuestra oficina durante el horario de atencin regular y recoger una tarjeta de cupones de GoodRx.  - Si necesita que su receta se enve electrnicamente a una farmacia diferente, informe a nuestra oficina a travs de MyChart de [place]   o por telfono llamando al [phone] y presione la opcin 4.  

## 2022-04-25 DIAGNOSIS — J301 Allergic rhinitis due to pollen: Secondary | ICD-10-CM | POA: Diagnosis not present

## 2022-04-25 DIAGNOSIS — J3081 Allergic rhinitis due to animal (cat) (dog) hair and dander: Secondary | ICD-10-CM | POA: Diagnosis not present

## 2022-04-25 DIAGNOSIS — J3089 Other allergic rhinitis: Secondary | ICD-10-CM | POA: Diagnosis not present

## 2022-05-02 DIAGNOSIS — J301 Allergic rhinitis due to pollen: Secondary | ICD-10-CM | POA: Diagnosis not present

## 2022-05-02 DIAGNOSIS — J3089 Other allergic rhinitis: Secondary | ICD-10-CM | POA: Diagnosis not present

## 2022-05-02 DIAGNOSIS — J3081 Allergic rhinitis due to animal (cat) (dog) hair and dander: Secondary | ICD-10-CM | POA: Diagnosis not present

## 2022-05-09 DIAGNOSIS — J301 Allergic rhinitis due to pollen: Secondary | ICD-10-CM | POA: Diagnosis not present

## 2022-05-09 DIAGNOSIS — J3081 Allergic rhinitis due to animal (cat) (dog) hair and dander: Secondary | ICD-10-CM | POA: Diagnosis not present

## 2022-05-09 DIAGNOSIS — J3089 Other allergic rhinitis: Secondary | ICD-10-CM | POA: Diagnosis not present

## 2022-05-16 DIAGNOSIS — J3081 Allergic rhinitis due to animal (cat) (dog) hair and dander: Secondary | ICD-10-CM | POA: Diagnosis not present

## 2022-05-16 DIAGNOSIS — J3089 Other allergic rhinitis: Secondary | ICD-10-CM | POA: Diagnosis not present

## 2022-05-16 DIAGNOSIS — J301 Allergic rhinitis due to pollen: Secondary | ICD-10-CM | POA: Diagnosis not present

## 2022-05-23 DIAGNOSIS — J301 Allergic rhinitis due to pollen: Secondary | ICD-10-CM | POA: Diagnosis not present

## 2022-05-23 DIAGNOSIS — J3089 Other allergic rhinitis: Secondary | ICD-10-CM | POA: Diagnosis not present

## 2022-05-23 DIAGNOSIS — J3081 Allergic rhinitis due to animal (cat) (dog) hair and dander: Secondary | ICD-10-CM | POA: Diagnosis not present

## 2022-06-08 DIAGNOSIS — J301 Allergic rhinitis due to pollen: Secondary | ICD-10-CM | POA: Diagnosis not present

## 2022-06-08 DIAGNOSIS — R21 Rash and other nonspecific skin eruption: Secondary | ICD-10-CM | POA: Diagnosis not present

## 2022-06-08 DIAGNOSIS — F17200 Nicotine dependence, unspecified, uncomplicated: Secondary | ICD-10-CM | POA: Diagnosis not present

## 2022-06-08 DIAGNOSIS — J3081 Allergic rhinitis due to animal (cat) (dog) hair and dander: Secondary | ICD-10-CM | POA: Diagnosis not present

## 2022-06-08 DIAGNOSIS — H1045 Other chronic allergic conjunctivitis: Secondary | ICD-10-CM | POA: Diagnosis not present

## 2022-06-08 DIAGNOSIS — J3089 Other allergic rhinitis: Secondary | ICD-10-CM | POA: Diagnosis not present

## 2022-06-16 DIAGNOSIS — J3089 Other allergic rhinitis: Secondary | ICD-10-CM | POA: Diagnosis not present

## 2022-06-16 DIAGNOSIS — J3081 Allergic rhinitis due to animal (cat) (dog) hair and dander: Secondary | ICD-10-CM | POA: Diagnosis not present

## 2022-06-16 DIAGNOSIS — J301 Allergic rhinitis due to pollen: Secondary | ICD-10-CM | POA: Diagnosis not present

## 2022-07-11 DIAGNOSIS — J3081 Allergic rhinitis due to animal (cat) (dog) hair and dander: Secondary | ICD-10-CM | POA: Diagnosis not present

## 2022-07-11 DIAGNOSIS — J3089 Other allergic rhinitis: Secondary | ICD-10-CM | POA: Diagnosis not present

## 2022-07-11 DIAGNOSIS — J301 Allergic rhinitis due to pollen: Secondary | ICD-10-CM | POA: Diagnosis not present

## 2022-07-20 DIAGNOSIS — J3089 Other allergic rhinitis: Secondary | ICD-10-CM | POA: Diagnosis not present

## 2022-07-20 DIAGNOSIS — J301 Allergic rhinitis due to pollen: Secondary | ICD-10-CM | POA: Diagnosis not present

## 2022-07-20 DIAGNOSIS — J3081 Allergic rhinitis due to animal (cat) (dog) hair and dander: Secondary | ICD-10-CM | POA: Diagnosis not present

## 2022-07-25 DIAGNOSIS — J301 Allergic rhinitis due to pollen: Secondary | ICD-10-CM | POA: Diagnosis not present

## 2022-07-25 DIAGNOSIS — J3089 Other allergic rhinitis: Secondary | ICD-10-CM | POA: Diagnosis not present

## 2022-07-25 DIAGNOSIS — J3081 Allergic rhinitis due to animal (cat) (dog) hair and dander: Secondary | ICD-10-CM | POA: Diagnosis not present

## 2022-08-03 DIAGNOSIS — J3089 Other allergic rhinitis: Secondary | ICD-10-CM | POA: Diagnosis not present

## 2022-08-03 DIAGNOSIS — J3081 Allergic rhinitis due to animal (cat) (dog) hair and dander: Secondary | ICD-10-CM | POA: Diagnosis not present

## 2022-08-03 DIAGNOSIS — J301 Allergic rhinitis due to pollen: Secondary | ICD-10-CM | POA: Diagnosis not present

## 2022-08-10 DIAGNOSIS — J301 Allergic rhinitis due to pollen: Secondary | ICD-10-CM | POA: Diagnosis not present

## 2022-08-10 DIAGNOSIS — J3089 Other allergic rhinitis: Secondary | ICD-10-CM | POA: Diagnosis not present

## 2022-08-10 DIAGNOSIS — J3081 Allergic rhinitis due to animal (cat) (dog) hair and dander: Secondary | ICD-10-CM | POA: Diagnosis not present

## 2022-08-22 DIAGNOSIS — J301 Allergic rhinitis due to pollen: Secondary | ICD-10-CM | POA: Diagnosis not present

## 2022-08-22 DIAGNOSIS — J3081 Allergic rhinitis due to animal (cat) (dog) hair and dander: Secondary | ICD-10-CM | POA: Diagnosis not present

## 2022-08-22 DIAGNOSIS — J3089 Other allergic rhinitis: Secondary | ICD-10-CM | POA: Diagnosis not present

## 2022-08-29 DIAGNOSIS — J3089 Other allergic rhinitis: Secondary | ICD-10-CM | POA: Diagnosis not present

## 2022-08-29 DIAGNOSIS — J3081 Allergic rhinitis due to animal (cat) (dog) hair and dander: Secondary | ICD-10-CM | POA: Diagnosis not present

## 2022-08-29 DIAGNOSIS — J301 Allergic rhinitis due to pollen: Secondary | ICD-10-CM | POA: Diagnosis not present

## 2022-09-14 DIAGNOSIS — J3081 Allergic rhinitis due to animal (cat) (dog) hair and dander: Secondary | ICD-10-CM | POA: Diagnosis not present

## 2022-09-14 DIAGNOSIS — J301 Allergic rhinitis due to pollen: Secondary | ICD-10-CM | POA: Diagnosis not present

## 2022-09-14 DIAGNOSIS — J3089 Other allergic rhinitis: Secondary | ICD-10-CM | POA: Diagnosis not present

## 2022-10-03 DIAGNOSIS — J3081 Allergic rhinitis due to animal (cat) (dog) hair and dander: Secondary | ICD-10-CM | POA: Diagnosis not present

## 2022-10-03 DIAGNOSIS — J301 Allergic rhinitis due to pollen: Secondary | ICD-10-CM | POA: Diagnosis not present

## 2022-10-03 DIAGNOSIS — J3089 Other allergic rhinitis: Secondary | ICD-10-CM | POA: Diagnosis not present

## 2022-10-17 ENCOUNTER — Ambulatory Visit: Payer: Medicare HMO | Admitting: Dermatology

## 2022-10-26 ENCOUNTER — Other Ambulatory Visit: Payer: Self-pay

## 2022-10-26 DIAGNOSIS — L2089 Other atopic dermatitis: Secondary | ICD-10-CM

## 2022-10-26 MED ORDER — DUPIXENT 300 MG/2ML ~~LOC~~ SOSY: 300.0000 mg | PREFILLED_SYRINGE | SUBCUTANEOUS | 0 refills | Status: DC

## 2022-11-07 DIAGNOSIS — J3081 Allergic rhinitis due to animal (cat) (dog) hair and dander: Secondary | ICD-10-CM | POA: Diagnosis not present

## 2022-11-07 DIAGNOSIS — J3089 Other allergic rhinitis: Secondary | ICD-10-CM | POA: Diagnosis not present

## 2022-11-07 DIAGNOSIS — J301 Allergic rhinitis due to pollen: Secondary | ICD-10-CM | POA: Diagnosis not present

## 2022-11-22 ENCOUNTER — Ambulatory Visit (INDEPENDENT_AMBULATORY_CARE_PROVIDER_SITE_OTHER): Payer: Medicare HMO | Admitting: Dermatology

## 2022-11-22 VITALS — BP 132/89 | HR 95

## 2022-11-22 DIAGNOSIS — Z79899 Other long term (current) drug therapy: Secondary | ICD-10-CM

## 2022-11-22 DIAGNOSIS — L2089 Other atopic dermatitis: Secondary | ICD-10-CM

## 2022-11-22 MED ORDER — DUPIXENT 300 MG/2ML ~~LOC~~ SOSY: 300.0000 mg | PREFILLED_SYRINGE | SUBCUTANEOUS | 5 refills | Status: DC

## 2022-11-22 MED ORDER — TACROLIMUS 0.1 % EX OINT: TOPICAL_OINTMENT | CUTANEOUS | 3 refills | Status: AC

## 2022-11-22 MED ORDER — MOMETASONE FUROATE 0.1 % EX CREA: TOPICAL_CREAM | CUTANEOUS | 3 refills | Status: AC

## 2022-11-22 NOTE — Progress Notes (Signed)
   Follow-Up Visit   Subjective  Kristine Campbell is a 42 y.o. female who presents for the following: Atopic Dermatitis trunk and extremities. Patient is well-controlled on Dupixent injections every 2 weeks. No side effects. Patient has some pain in left eye, but doubt related to Dupixent. She has seen eye doctor, but they didn't find anything. She has mometasone cream if needed for flares, but mostly stays clear. Some itching and dryness on forehead.    The following portions of the chart were reviewed this encounter and updated as appropriate: medications, allergies, medical history  Review of Systems:  No other skin or systemic complaints except as noted in HPI or Assessment and Plan.  Objective  Well appearing patient in no apparent distress; mood and affect are within normal limits.  Areas Examined: Face, extremities, trunk  Relevant physical exam findings are noted in the Assessment and Plan.    Assessment & Plan   Other atopic dermatitis  Related Medications mometasone (ELOCON) 0.1 % cream Apply to affected areas rash once to twice daily until improved.  dupilumab (DUPIXENT) 300 MG/2ML prefilled syringe Inject 300 mg into the skin every 14 (fourteen) days. Starting at day 15 for maintenance.     ATOPIC DERMATITIS Exam: hyperpigmented macules with fading on the bilateral arms, elbows, flanks; hyperpigmented patch with xerosis on forehead. 1% BSA  Chronic and persistent condition with duration or expected duration over one year. Condition is symptomatic/ bothersome to patient. Not currently at goal. Currently mild flare at forehead.   Atopic dermatitis - Severe, on Dupixent (biologic medication).  Atopic dermatitis (eczema) is a chronic, relapsing, pruritic condition that can significantly affect quality of life. It is often associated with allergic rhinitis and/or asthma and can require treatment with topical medications, phototherapy, or in severe cases biologic  medications, which require long term medication management.    Treatment Plan: Start tacrolimus ointment 0.1% apply 1-2 times a day to affected areas face/body dsp 60 g 3Rf. Treat forehead. If not improving consider adding ketoconazole cream Continue mometasone cream 1-2 times a day for more severe flares. Avoid applying to face, groin, and axilla. Use as directed. Long-term use can cause thinning of the skin.  Continue Dupixent 300 mg/54mL SQ QOW. Patient denies side effects. 5Rf sent to [facility].  Potential side effects include allergic reaction, herpes infections, injection site reactions and conjunctivitis (inflammation of the eyes).  The use of Dupixent requires long term medication management, including periodic office visits.    Long term medication management.  Patient is using long term (months to years) prescription medication  to control their dermatologic condition.  These medications require periodic monitoring to evaluate for efficacy and side effects and may require periodic laboratory monitoring.   Recommend gentle skin care.   Return in about 6 months (around 05/22/2023) for Atopic Dermatitis.  ICherlyn Labella, CMA, am acting as scribe for Willeen Niece, MD .   Documentation: I have reviewed the above documentation for accuracy and completeness, and I agree with the above.  Willeen Niece, MD

## 2022-11-22 NOTE — Patient Instructions (Addendum)
Dupilumab (Dupixent) is a treatment given by injection for adults and children with moderate-to-severe atopic dermatitis. Goal is control of skin condition, not cure. It is given as 2 injections at the first dose followed by 1 injection ever 2 weeks thereafter.  Young children are dosed monthly.  Potential side effects include allergic reaction, herpes infections, injection site reactions and conjunctivitis (inflammation of the eyes).  The use of Dupixent requires long term medication management, including periodic office visits.  Topical steroids (such as triamcinolone, fluocinolone, fluocinonide, mometasone, clobetasol, halobetasol, betamethasone, hydrocortisone) can cause thinning and lightening of the skin if they are used for too long in the same area. Your physician has selected the right strength medicine for your problem and area affected on the body. Please use your medication only as directed by your physician to prevent side effects.    Due to recent changes in healthcare laws, you may see results of your pathology and/or laboratory studies on MyChart before the doctors have had a chance to review them. We understand that in some cases there may be results that are confusing or concerning to you. Please understand that not all results are received at the same time and often the doctors may need to interpret multiple results in order to provide you with the best plan of care or course of treatment. Therefore, we ask that you please give [place] 2 business days to thoroughly review all your results before contacting the office for clarification. Should we see a critical lab result, you will be contacted sooner.   If You Need Anything After Your Visit  If you have any questions or concerns for your doctor, please call our main line at [phone] and press option 4 to reach your doctor's medical assistant. If no one answers, please leave a voicemail as directed and we will return your call as soon as  possible. Messages left after 4 pm will be answered the following business day.   You may also send [place] a message via MyChart. We typically respond to MyChart messages within 1-2 business days.  For prescription refills, please ask your pharmacy to contact our office. Our fax number is [phone].  If you have an urgent issue when the clinic is closed that cannot wait until the next business day, you can page your doctor at the number below.    Please note that while we do our best to be available for urgent issues outside of office hours, we are not available 24/7.   If you have an urgent issue and are unable to reach [place], you may choose to seek medical care at your doctor's office, retail clinic, urgent care center, or emergency room.  If you have a medical emergency, please immediately call 911 or go to the emergency department.  Pager Numbers  - Dr. Gwen Pounds: [phone]  - Dr. Roseanne Reno: [phone]  - Dr. Katrinka Blazing: [phone]   In the event of inclement weather, please call our main line at [phone] for an update on the status of any delays or closures.  Dermatology Medication Tips: Please keep the boxes that topical medications come in in order to help keep track of the instructions about where and how to use these. Pharmacies typically print the medication instructions only on the boxes and not directly on the medication tubes.   If your medication is too expensive, please contact our office at [phone] option 4 or send [place] a message through MyChart.   We are unable to tell what your co-pay  for medications will be in advance as this is different depending on your insurance coverage. However, we may be able to find a substitute medication at lower cost or fill out paperwork to get insurance to cover a needed medication.   If a prior authorization is required to get your medication covered by your insurance company, please allow [place] 1-2 business days to complete this  process.  Drug prices often vary depending on where the prescription is filled and some pharmacies may offer cheaper prices.  The website www.goodrx.com contains coupons for medications through different pharmacies. The prices here do not account for what the cost may be with help from insurance (it may be cheaper with your insurance), but the website can give you the price if you did not use any insurance.  - You can print the associated coupon and take it with your prescription to the pharmacy.  - You may also stop by our office during regular business hours and pick up a GoodRx coupon card.  - If you need your prescription sent electronically to a different pharmacy, notify our office through [facility] or by phone at [phone] option 4.     Si Usted Necesita Algo Despus de Su Visita  Tambin puede enviarnos un mensaje a travs de Clinical cytogeneticist. Por lo general respondemos a los mensajes de MyChart en el transcurso de 1 a 2 das hbiles.  Para renovar recetas, por favor pida a su farmacia que se ponga en contacto con nuestra oficina. Annie Sable de fax es [place] [phone].  Si tiene un asunto urgente cuando la clnica est cerrada y que no puede esperar hasta el siguiente da hbil, puede llamar/localizar a su doctor(a) al nmero que aparece a continuacin.   Por favor, tenga en cuenta que aunque hacemos todo lo posible para estar disponibles para asuntos urgentes fuera del horario de [place], no estamos disponibles las 24 horas del da, los 7 [address]  [address] de la [place].   Si tiene un problema urgente y no puede comunicarse con nosotros, puede optar por buscar atencin mdica  en el consultorio de su doctor(a), en una clnica privada, en un centro de atencin urgente o en una sala de emergencias.  Si tiene Engineer, drilling, por favor llame inmediatamente al 911 o vaya a la sala de emergencias.  Nmeros de bper  - Dr. Gwen Pounds: [phone]  - Dra. Roseanne Reno: [phone]  - Dr.  Katrinka Blazing: [phone]   En caso de inclemencias del tiempo, por favor llame a Lacy Duverney principal al [phone] para una actualizacin sobre el [place] de cualquier retraso o cierre.  Consejos para la medicacin en dermatologa: Por favor, guarde las cajas en las que vienen los medicamentos de uso tpico para ayudarle a seguir las instrucciones sobre dnde y cmo usarlos. Las farmacias generalmente imprimen las instrucciones del medicamento slo en las cajas y no directamente en los tubos del [place].   Si su medicamento es muy caro, por favor, pngase en contacto con Rolm Gala llamando al [phone] y presione la opcin 4 o envenos un mensaje a travs de Clinical cytogeneticist.   No podemos decirle cul ser su copago por los medicamentos por adelantado ya que esto es diferente dependiendo de la cobertura de su seguro. Sin embargo, es posible que podamos encontrar un medicamento sustituto a Audiological scientist un formulario para que el seguro cubra el medicamento que se considera necesario.   Si se requiere una autorizacin previa para que su compaa de seguros [place] su medicamento, por  favor permtanos de 1 a 2 das hbiles para completar [address]  Los precios de los medicamentos varan con frecuencia dependiendo del Environmental consultant de dnde se surte la receta y alguna farmacias pueden ofrecer precios ms baratos.  El sitio web www.goodrx.com tiene cupones para medicamentos de Health and safety inspector. Los precios aqu no tienen en cuenta lo que podra costar con la ayuda del seguro (puede ser ms barato con su seguro), pero el sitio web puede darle el precio si no utiliz Tourist information centre manager.  - Puede imprimir el cupn correspondiente y llevarlo con su receta a la farmacia.  - Tambin puede pasar por nuestra oficina durante el horario de atencin regular y Education officer, museum una tarjeta de cupones de GoodRx.  - Si necesita que su receta se enve electrnicamente a una farmacia diferente, informe a nuestra oficina a  travs de MyChart de [place] o por telfono llamando al [phone] y presione la opcin 4.

## 2022-11-24 DIAGNOSIS — J3081 Allergic rhinitis due to animal (cat) (dog) hair and dander: Secondary | ICD-10-CM | POA: Diagnosis not present

## 2022-11-24 DIAGNOSIS — J301 Allergic rhinitis due to pollen: Secondary | ICD-10-CM | POA: Diagnosis not present

## 2022-11-24 DIAGNOSIS — J3089 Other allergic rhinitis: Secondary | ICD-10-CM | POA: Diagnosis not present

## 2022-12-08 DIAGNOSIS — J3089 Other allergic rhinitis: Secondary | ICD-10-CM | POA: Diagnosis not present

## 2022-12-08 DIAGNOSIS — J3081 Allergic rhinitis due to animal (cat) (dog) hair and dander: Secondary | ICD-10-CM | POA: Diagnosis not present

## 2022-12-08 DIAGNOSIS — J301 Allergic rhinitis due to pollen: Secondary | ICD-10-CM | POA: Diagnosis not present

## 2022-12-14 DIAGNOSIS — J3081 Allergic rhinitis due to animal (cat) (dog) hair and dander: Secondary | ICD-10-CM | POA: Diagnosis not present

## 2022-12-14 DIAGNOSIS — J301 Allergic rhinitis due to pollen: Secondary | ICD-10-CM | POA: Diagnosis not present

## 2022-12-14 DIAGNOSIS — J3089 Other allergic rhinitis: Secondary | ICD-10-CM | POA: Diagnosis not present

## 2022-12-19 DIAGNOSIS — J3081 Allergic rhinitis due to animal (cat) (dog) hair and dander: Secondary | ICD-10-CM | POA: Diagnosis not present

## 2022-12-19 DIAGNOSIS — J3089 Other allergic rhinitis: Secondary | ICD-10-CM | POA: Diagnosis not present

## 2022-12-19 DIAGNOSIS — J301 Allergic rhinitis due to pollen: Secondary | ICD-10-CM | POA: Diagnosis not present

## 2023-01-04 DIAGNOSIS — J3089 Other allergic rhinitis: Secondary | ICD-10-CM | POA: Diagnosis not present

## 2023-01-04 DIAGNOSIS — J3081 Allergic rhinitis due to animal (cat) (dog) hair and dander: Secondary | ICD-10-CM | POA: Diagnosis not present

## 2023-01-04 DIAGNOSIS — J301 Allergic rhinitis due to pollen: Secondary | ICD-10-CM | POA: Diagnosis not present

## 2023-01-12 DIAGNOSIS — J3081 Allergic rhinitis due to animal (cat) (dog) hair and dander: Secondary | ICD-10-CM | POA: Diagnosis not present

## 2023-01-12 DIAGNOSIS — J3089 Other allergic rhinitis: Secondary | ICD-10-CM | POA: Diagnosis not present

## 2023-01-12 DIAGNOSIS — J301 Allergic rhinitis due to pollen: Secondary | ICD-10-CM | POA: Diagnosis not present

## 2023-01-18 DIAGNOSIS — J3081 Allergic rhinitis due to animal (cat) (dog) hair and dander: Secondary | ICD-10-CM | POA: Diagnosis not present

## 2023-01-18 DIAGNOSIS — J3089 Other allergic rhinitis: Secondary | ICD-10-CM | POA: Diagnosis not present

## 2023-01-18 DIAGNOSIS — J301 Allergic rhinitis due to pollen: Secondary | ICD-10-CM | POA: Diagnosis not present

## 2023-01-25 DIAGNOSIS — J301 Allergic rhinitis due to pollen: Secondary | ICD-10-CM | POA: Diagnosis not present

## 2023-01-25 DIAGNOSIS — J3081 Allergic rhinitis due to animal (cat) (dog) hair and dander: Secondary | ICD-10-CM | POA: Diagnosis not present

## 2023-01-25 DIAGNOSIS — J3089 Other allergic rhinitis: Secondary | ICD-10-CM | POA: Diagnosis not present

## 2023-02-08 DIAGNOSIS — J3089 Other allergic rhinitis: Secondary | ICD-10-CM | POA: Diagnosis not present

## 2023-02-08 DIAGNOSIS — J3081 Allergic rhinitis due to animal (cat) (dog) hair and dander: Secondary | ICD-10-CM | POA: Diagnosis not present

## 2023-02-08 DIAGNOSIS — J301 Allergic rhinitis due to pollen: Secondary | ICD-10-CM | POA: Diagnosis not present

## 2023-02-15 DIAGNOSIS — J3081 Allergic rhinitis due to animal (cat) (dog) hair and dander: Secondary | ICD-10-CM | POA: Diagnosis not present

## 2023-02-15 DIAGNOSIS — J3089 Other allergic rhinitis: Secondary | ICD-10-CM | POA: Diagnosis not present

## 2023-02-15 DIAGNOSIS — J301 Allergic rhinitis due to pollen: Secondary | ICD-10-CM | POA: Diagnosis not present

## 2023-03-08 DIAGNOSIS — J3089 Other allergic rhinitis: Secondary | ICD-10-CM | POA: Diagnosis not present

## 2023-03-08 DIAGNOSIS — J3081 Allergic rhinitis due to animal (cat) (dog) hair and dander: Secondary | ICD-10-CM | POA: Diagnosis not present

## 2023-03-08 DIAGNOSIS — J301 Allergic rhinitis due to pollen: Secondary | ICD-10-CM | POA: Diagnosis not present

## 2023-03-16 ENCOUNTER — Other Ambulatory Visit: Payer: Self-pay | Admitting: Infectious Diseases

## 2023-03-16 DIAGNOSIS — K219 Gastro-esophageal reflux disease without esophagitis: Secondary | ICD-10-CM | POA: Diagnosis not present

## 2023-03-16 DIAGNOSIS — Z1231 Encounter for screening mammogram for malignant neoplasm of breast: Secondary | ICD-10-CM | POA: Diagnosis not present

## 2023-03-16 DIAGNOSIS — E611 Iron deficiency: Secondary | ICD-10-CM | POA: Diagnosis not present

## 2023-03-16 DIAGNOSIS — R270 Ataxia, unspecified: Secondary | ICD-10-CM | POA: Diagnosis not present

## 2023-03-16 DIAGNOSIS — G600 Hereditary motor and sensory neuropathy: Secondary | ICD-10-CM | POA: Diagnosis not present

## 2023-03-16 DIAGNOSIS — Z0000 Encounter for general adult medical examination without abnormal findings: Secondary | ICD-10-CM | POA: Diagnosis not present

## 2023-03-21 DIAGNOSIS — R778 Other specified abnormalities of plasma proteins: Secondary | ICD-10-CM | POA: Diagnosis not present

## 2023-03-21 DIAGNOSIS — Z114 Encounter for screening for human immunodeficiency virus [HIV]: Secondary | ICD-10-CM | POA: Diagnosis not present

## 2023-03-22 DIAGNOSIS — M2560 Stiffness of unspecified joint, not elsewhere classified: Secondary | ICD-10-CM | POA: Diagnosis not present

## 2023-03-22 DIAGNOSIS — R270 Ataxia, unspecified: Secondary | ICD-10-CM | POA: Diagnosis not present

## 2023-03-22 DIAGNOSIS — G600 Hereditary motor and sensory neuropathy: Secondary | ICD-10-CM | POA: Diagnosis not present

## 2023-03-22 DIAGNOSIS — R52 Pain, unspecified: Secondary | ICD-10-CM | POA: Diagnosis not present

## 2023-03-22 DIAGNOSIS — F17209 Nicotine dependence, unspecified, with unspecified nicotine-induced disorders: Secondary | ICD-10-CM | POA: Diagnosis not present

## 2023-03-29 DIAGNOSIS — J3081 Allergic rhinitis due to animal (cat) (dog) hair and dander: Secondary | ICD-10-CM | POA: Diagnosis not present

## 2023-03-29 DIAGNOSIS — J3089 Other allergic rhinitis: Secondary | ICD-10-CM | POA: Diagnosis not present

## 2023-03-29 DIAGNOSIS — J301 Allergic rhinitis due to pollen: Secondary | ICD-10-CM | POA: Diagnosis not present

## 2023-04-03 DIAGNOSIS — J3081 Allergic rhinitis due to animal (cat) (dog) hair and dander: Secondary | ICD-10-CM | POA: Diagnosis not present

## 2023-04-03 DIAGNOSIS — J3089 Other allergic rhinitis: Secondary | ICD-10-CM | POA: Diagnosis not present

## 2023-04-03 DIAGNOSIS — J301 Allergic rhinitis due to pollen: Secondary | ICD-10-CM | POA: Diagnosis not present

## 2023-04-04 ENCOUNTER — Ambulatory Visit
Admission: RE | Admit: 2023-04-04 | Discharge: 2023-04-04 | Disposition: A | Discharge status: Home / Self Care | Payer: Medicare HMO | Source: Ambulatory Visit | Attending: Infectious Diseases | Admitting: Infectious Diseases

## 2023-04-04 DIAGNOSIS — Z1231 Encounter for screening mammogram for malignant neoplasm of breast: Secondary | ICD-10-CM | POA: Diagnosis not present

## 2023-04-09 ENCOUNTER — Other Ambulatory Visit: Payer: Self-pay | Admitting: Infectious Diseases

## 2023-04-09 DIAGNOSIS — R928 Other abnormal and inconclusive findings on diagnostic imaging of breast: Secondary | ICD-10-CM

## 2023-04-10 DIAGNOSIS — J3089 Other allergic rhinitis: Secondary | ICD-10-CM | POA: Diagnosis not present

## 2023-04-12 ENCOUNTER — Ambulatory Visit
Admission: RE | Admit: 2023-04-12 | Discharge: 2023-04-12 | Disposition: A | Discharge status: Home / Self Care | Payer: Medicare HMO | Source: Ambulatory Visit | Attending: Infectious Diseases | Admitting: Infectious Diseases

## 2023-04-12 DIAGNOSIS — R928 Other abnormal and inconclusive findings on diagnostic imaging of breast: Secondary | ICD-10-CM | POA: Diagnosis not present

## 2023-04-12 DIAGNOSIS — R92333 Mammographic heterogeneous density, bilateral breasts: Secondary | ICD-10-CM | POA: Diagnosis not present

## 2023-04-20 DIAGNOSIS — J3081 Allergic rhinitis due to animal (cat) (dog) hair and dander: Secondary | ICD-10-CM | POA: Diagnosis not present

## 2023-04-20 DIAGNOSIS — J3089 Other allergic rhinitis: Secondary | ICD-10-CM | POA: Diagnosis not present

## 2023-04-20 DIAGNOSIS — J301 Allergic rhinitis due to pollen: Secondary | ICD-10-CM | POA: Diagnosis not present

## 2023-04-30 ENCOUNTER — Other Ambulatory Visit: Payer: Self-pay

## 2023-04-30 DIAGNOSIS — L2089 Other atopic dermatitis: Secondary | ICD-10-CM

## 2023-04-30 MED ORDER — DUPIXENT 300 MG/2ML ~~LOC~~ SOSY
300.0000 mg | PREFILLED_SYRINGE | SUBCUTANEOUS | 0 refills | Status: DC
Start: 2023-04-30 — End: 2023-05-21

## 2023-04-30 NOTE — Progress Notes (Signed)
 Refill request per fax request. aw

## 2023-05-01 DIAGNOSIS — J301 Allergic rhinitis due to pollen: Secondary | ICD-10-CM | POA: Diagnosis not present

## 2023-05-01 DIAGNOSIS — J3081 Allergic rhinitis due to animal (cat) (dog) hair and dander: Secondary | ICD-10-CM | POA: Diagnosis not present

## 2023-05-01 DIAGNOSIS — J3089 Other allergic rhinitis: Secondary | ICD-10-CM | POA: Diagnosis not present

## 2023-05-02 DIAGNOSIS — J3089 Other allergic rhinitis: Secondary | ICD-10-CM | POA: Diagnosis not present

## 2023-05-11 DIAGNOSIS — J301 Allergic rhinitis due to pollen: Secondary | ICD-10-CM | POA: Diagnosis not present

## 2023-05-11 DIAGNOSIS — J3089 Other allergic rhinitis: Secondary | ICD-10-CM | POA: Diagnosis not present

## 2023-05-11 DIAGNOSIS — J3081 Allergic rhinitis due to animal (cat) (dog) hair and dander: Secondary | ICD-10-CM | POA: Diagnosis not present

## 2023-05-21 ENCOUNTER — Other Ambulatory Visit: Payer: Self-pay

## 2023-05-21 DIAGNOSIS — L2089 Other atopic dermatitis: Secondary | ICD-10-CM

## 2023-05-21 MED ORDER — DUPIXENT 300 MG/2ML ~~LOC~~ SOSY: 300.0000 mg | PREFILLED_SYRINGE | SUBCUTANEOUS | 0 refills | Status: DC

## 2023-05-22 DIAGNOSIS — J3089 Other allergic rhinitis: Secondary | ICD-10-CM | POA: Diagnosis not present

## 2023-05-29 DIAGNOSIS — J301 Allergic rhinitis due to pollen: Secondary | ICD-10-CM | POA: Diagnosis not present

## 2023-05-29 DIAGNOSIS — J3089 Other allergic rhinitis: Secondary | ICD-10-CM | POA: Diagnosis not present

## 2023-05-29 DIAGNOSIS — J3081 Allergic rhinitis due to animal (cat) (dog) hair and dander: Secondary | ICD-10-CM | POA: Diagnosis not present

## 2023-06-04 ENCOUNTER — Ambulatory Visit: Payer: Medicare HMO | Admitting: Dermatology

## 2023-06-13 ENCOUNTER — Other Ambulatory Visit: Payer: Self-pay

## 2023-06-13 DIAGNOSIS — L2089 Other atopic dermatitis: Secondary | ICD-10-CM

## 2023-06-13 MED ORDER — DUPIXENT 300 MG/2ML ~~LOC~~ SOSY: 300.0000 mg | PREFILLED_SYRINGE | SUBCUTANEOUS | 1 refills | Status: DC

## 2023-06-13 NOTE — Progress Notes (Signed)
 Patient called requesting refills of Dupixent. She has a follow up with Dr. Roseanne Reno in May.

## 2023-07-05 DIAGNOSIS — J3081 Allergic rhinitis due to animal (cat) (dog) hair and dander: Secondary | ICD-10-CM | POA: Diagnosis not present

## 2023-07-05 DIAGNOSIS — J3089 Other allergic rhinitis: Secondary | ICD-10-CM | POA: Diagnosis not present

## 2023-07-05 DIAGNOSIS — J301 Allergic rhinitis due to pollen: Secondary | ICD-10-CM | POA: Diagnosis not present

## 2023-07-11 ENCOUNTER — Ambulatory Visit: Admitting: Dermatology

## 2023-07-12 DIAGNOSIS — J3081 Allergic rhinitis due to animal (cat) (dog) hair and dander: Secondary | ICD-10-CM | POA: Diagnosis not present

## 2023-07-12 DIAGNOSIS — J301 Allergic rhinitis due to pollen: Secondary | ICD-10-CM | POA: Diagnosis not present

## 2023-07-12 DIAGNOSIS — J3089 Other allergic rhinitis: Secondary | ICD-10-CM | POA: Diagnosis not present

## 2023-07-16 ENCOUNTER — Telehealth: Payer: Self-pay

## 2023-07-16 ENCOUNTER — Ambulatory Visit: Admitting: Dermatology

## 2023-07-16 DIAGNOSIS — L209 Atopic dermatitis, unspecified: Secondary | ICD-10-CM | POA: Diagnosis not present

## 2023-07-16 DIAGNOSIS — Z79899 Other long term (current) drug therapy: Secondary | ICD-10-CM

## 2023-07-16 DIAGNOSIS — Z7189 Other specified counseling: Secondary | ICD-10-CM

## 2023-07-16 NOTE — Progress Notes (Signed)
   Follow-Up Visit   Subjective  Kristine Campbell is a 43 y.o. female who presents for the following: Atopic Dermatitis, patient's last Dupixent  injection was 06/22/23. When she missed her 2 week injection, itch increased and skin flared. She is flared while off Dupixent , but is normally well-controlled while on Dupixent  injections without itch. She uses mometasone  cream and tacolimus ointment as needed.    The following portions of the chart were reviewed this encounter and updated as appropriate: medications, allergies, medical history  Review of Systems:  No other skin or systemic complaints except as noted in HPI or Assessment and Plan.  Objective  Well appearing patient in no apparent distress; mood and affect are within normal limits.  Areas Examined: Arms, elbows, flanks, face  Relevant physical exam findings are noted in the Assessment and Plan.    Assessment & Plan      ATOPIC DERMATITIS Exam: Follicular hyperpigmented papules on the right mid back and arms with associated xerosis; pink scaly patches on the back; hypo and hyperpigmented scaly patches on the legs.  6% BSA (when on Dupixent  injections, BSA was 1%)  Chronic and persistent condition with duration or expected duration over one year. Condition is bothersome/symptomatic for patient. Currently flared while off Dupixent .  Pt is well-controlled when taking Dupixent  regularly.   Atopic dermatitis - Severe, on Dupixent  (biologic medication).  Atopic dermatitis (eczema) is a chronic, relapsing, pruritic condition that can significantly affect quality of life. It is often associated with allergic rhinitis and/or asthma and can require treatment with topical medications, phototherapy, or in severe cases biologic medications, which require long term medication management.    Treatment Plan: Continue tacrolimus  ointment once to twice daily to aa's face/body. Continue Mometasone  0.1% cream to aa's QD-BID PRN.  Topical  steroids (such as triamcinolone, fluocinolone, fluocinonide, mometasone , clobetasol, halobetasol, betamethasone, hydrocortisone) can cause thinning and lightening of the skin if they are used for too long in the same area. Your physician has selected the right strength medicine for your problem and area affected on the body. Please use your medication only as directed by your physician to prevent side effects.   Restart Dupixent  300 mg/2mL SQ QOW. Patient denies side effects. Dupixent  pen sample given to patient today. Lot [id number] Exp 03/05/2025  Potential side effects include allergic reaction, herpes infections, injection site reactions and conjunctivitis (inflammation of the eyes).  The use of Dupixent  requires long term medication management, including periodic office visits.    Long term medication management.  Patient is using long term (months to years) prescription medication  to control their dermatologic condition.  These medications require periodic monitoring to evaluate for efficacy and side effects and may require periodic laboratory monitoring.   Recommend gentle skin care.   Return in about 6 months (around 01/16/2024) for Atopic Dermatitis.  IBernardine Bridegroom, CMA, am acting as scribe for Artemio Larry, MD .   Documentation: I have reviewed the above documentation for accuracy and completeness, and I agree with the above.  Artemio Larry, MD

## 2023-07-16 NOTE — Patient Instructions (Signed)

## 2023-07-16 NOTE — Telephone Encounter (Signed)
 [facility] requesting chart notes showing progress and positive clinical  response with Dupixent . Please advise.   [facility] portal or fax [phone]

## 2023-07-17 NOTE — Telephone Encounter (Signed)
 Notes sent to [facility]. aw

## 2023-07-19 DIAGNOSIS — J3089 Other allergic rhinitis: Secondary | ICD-10-CM | POA: Diagnosis not present

## 2023-07-19 DIAGNOSIS — J301 Allergic rhinitis due to pollen: Secondary | ICD-10-CM | POA: Diagnosis not present

## 2023-07-19 DIAGNOSIS — J3081 Allergic rhinitis due to animal (cat) (dog) hair and dander: Secondary | ICD-10-CM | POA: Diagnosis not present

## 2023-07-24 DIAGNOSIS — J3089 Other allergic rhinitis: Secondary | ICD-10-CM | POA: Diagnosis not present

## 2023-07-24 DIAGNOSIS — J3081 Allergic rhinitis due to animal (cat) (dog) hair and dander: Secondary | ICD-10-CM | POA: Diagnosis not present

## 2023-07-24 DIAGNOSIS — J301 Allergic rhinitis due to pollen: Secondary | ICD-10-CM | POA: Diagnosis not present

## 2023-08-02 DIAGNOSIS — J301 Allergic rhinitis due to pollen: Secondary | ICD-10-CM | POA: Diagnosis not present

## 2023-08-02 DIAGNOSIS — J3081 Allergic rhinitis due to animal (cat) (dog) hair and dander: Secondary | ICD-10-CM | POA: Diagnosis not present

## 2023-08-02 DIAGNOSIS — J3089 Other allergic rhinitis: Secondary | ICD-10-CM | POA: Diagnosis not present

## 2023-08-06 ENCOUNTER — Other Ambulatory Visit: Payer: Self-pay

## 2023-08-06 DIAGNOSIS — L2089 Other atopic dermatitis: Secondary | ICD-10-CM

## 2023-08-06 MED ORDER — DUPIXENT 300 MG/2ML ~~LOC~~ SOSY: 300.0000 mg | PREFILLED_SYRINGE | SUBCUTANEOUS | 5 refills | Status: DC

## 2023-08-07 DIAGNOSIS — J3089 Other allergic rhinitis: Secondary | ICD-10-CM | POA: Diagnosis not present

## 2023-08-07 DIAGNOSIS — J3081 Allergic rhinitis due to animal (cat) (dog) hair and dander: Secondary | ICD-10-CM | POA: Diagnosis not present

## 2023-08-07 DIAGNOSIS — J301 Allergic rhinitis due to pollen: Secondary | ICD-10-CM | POA: Diagnosis not present

## 2023-08-17 DIAGNOSIS — J3089 Other allergic rhinitis: Secondary | ICD-10-CM | POA: Diagnosis not present

## 2023-08-17 DIAGNOSIS — J301 Allergic rhinitis due to pollen: Secondary | ICD-10-CM | POA: Diagnosis not present

## 2023-08-17 DIAGNOSIS — J3081 Allergic rhinitis due to animal (cat) (dog) hair and dander: Secondary | ICD-10-CM | POA: Diagnosis not present

## 2023-08-21 DIAGNOSIS — J3081 Allergic rhinitis due to animal (cat) (dog) hair and dander: Secondary | ICD-10-CM | POA: Diagnosis not present

## 2023-08-21 DIAGNOSIS — J3089 Other allergic rhinitis: Secondary | ICD-10-CM | POA: Diagnosis not present

## 2023-08-21 DIAGNOSIS — J301 Allergic rhinitis due to pollen: Secondary | ICD-10-CM | POA: Diagnosis not present

## 2023-08-31 DIAGNOSIS — J3089 Other allergic rhinitis: Secondary | ICD-10-CM | POA: Diagnosis not present

## 2023-08-31 DIAGNOSIS — J301 Allergic rhinitis due to pollen: Secondary | ICD-10-CM | POA: Diagnosis not present

## 2023-08-31 DIAGNOSIS — J3081 Allergic rhinitis due to animal (cat) (dog) hair and dander: Secondary | ICD-10-CM | POA: Diagnosis not present

## 2023-09-11 DIAGNOSIS — J3081 Allergic rhinitis due to animal (cat) (dog) hair and dander: Secondary | ICD-10-CM | POA: Diagnosis not present

## 2023-09-11 DIAGNOSIS — J301 Allergic rhinitis due to pollen: Secondary | ICD-10-CM | POA: Diagnosis not present

## 2023-09-11 DIAGNOSIS — J3089 Other allergic rhinitis: Secondary | ICD-10-CM | POA: Diagnosis not present

## 2023-09-21 DIAGNOSIS — J301 Allergic rhinitis due to pollen: Secondary | ICD-10-CM | POA: Diagnosis not present

## 2023-09-21 DIAGNOSIS — J3081 Allergic rhinitis due to animal (cat) (dog) hair and dander: Secondary | ICD-10-CM | POA: Diagnosis not present

## 2023-09-21 DIAGNOSIS — J3089 Other allergic rhinitis: Secondary | ICD-10-CM | POA: Diagnosis not present

## 2023-09-27 DIAGNOSIS — J3081 Allergic rhinitis due to animal (cat) (dog) hair and dander: Secondary | ICD-10-CM | POA: Diagnosis not present

## 2023-09-27 DIAGNOSIS — J3089 Other allergic rhinitis: Secondary | ICD-10-CM | POA: Diagnosis not present

## 2023-09-27 DIAGNOSIS — J301 Allergic rhinitis due to pollen: Secondary | ICD-10-CM | POA: Diagnosis not present

## 2023-10-02 DIAGNOSIS — J3081 Allergic rhinitis due to animal (cat) (dog) hair and dander: Secondary | ICD-10-CM | POA: Diagnosis not present

## 2023-10-02 DIAGNOSIS — J3089 Other allergic rhinitis: Secondary | ICD-10-CM | POA: Diagnosis not present

## 2023-10-02 DIAGNOSIS — J301 Allergic rhinitis due to pollen: Secondary | ICD-10-CM | POA: Diagnosis not present

## 2023-10-09 DIAGNOSIS — J3089 Other allergic rhinitis: Secondary | ICD-10-CM | POA: Diagnosis not present

## 2023-10-25 DIAGNOSIS — J301 Allergic rhinitis due to pollen: Secondary | ICD-10-CM | POA: Diagnosis not present

## 2023-10-25 DIAGNOSIS — J3089 Other allergic rhinitis: Secondary | ICD-10-CM | POA: Diagnosis not present

## 2023-10-25 DIAGNOSIS — J3081 Allergic rhinitis due to animal (cat) (dog) hair and dander: Secondary | ICD-10-CM | POA: Diagnosis not present

## 2023-11-01 DIAGNOSIS — J3089 Other allergic rhinitis: Secondary | ICD-10-CM | POA: Diagnosis not present

## 2023-11-08 DIAGNOSIS — J3081 Allergic rhinitis due to animal (cat) (dog) hair and dander: Secondary | ICD-10-CM | POA: Diagnosis not present

## 2023-11-08 DIAGNOSIS — J301 Allergic rhinitis due to pollen: Secondary | ICD-10-CM | POA: Diagnosis not present

## 2023-11-08 DIAGNOSIS — J3089 Other allergic rhinitis: Secondary | ICD-10-CM | POA: Diagnosis not present

## 2023-11-15 DIAGNOSIS — J3089 Other allergic rhinitis: Secondary | ICD-10-CM | POA: Diagnosis not present

## 2023-11-22 DIAGNOSIS — J3089 Other allergic rhinitis: Secondary | ICD-10-CM | POA: Diagnosis not present

## 2023-11-22 DIAGNOSIS — J301 Allergic rhinitis due to pollen: Secondary | ICD-10-CM | POA: Diagnosis not present

## 2023-11-22 DIAGNOSIS — J3081 Allergic rhinitis due to animal (cat) (dog) hair and dander: Secondary | ICD-10-CM | POA: Diagnosis not present

## 2023-11-29 DIAGNOSIS — J3089 Other allergic rhinitis: Secondary | ICD-10-CM | POA: Diagnosis not present

## 2023-12-14 DIAGNOSIS — J3089 Other allergic rhinitis: Secondary | ICD-10-CM | POA: Diagnosis not present

## 2023-12-14 DIAGNOSIS — J301 Allergic rhinitis due to pollen: Secondary | ICD-10-CM | POA: Diagnosis not present

## 2023-12-14 DIAGNOSIS — J3081 Allergic rhinitis due to animal (cat) (dog) hair and dander: Secondary | ICD-10-CM | POA: Diagnosis not present

## 2023-12-18 DIAGNOSIS — J3081 Allergic rhinitis due to animal (cat) (dog) hair and dander: Secondary | ICD-10-CM | POA: Diagnosis not present

## 2023-12-18 DIAGNOSIS — J301 Allergic rhinitis due to pollen: Secondary | ICD-10-CM | POA: Diagnosis not present

## 2023-12-18 DIAGNOSIS — J3089 Other allergic rhinitis: Secondary | ICD-10-CM | POA: Diagnosis not present

## 2023-12-25 DIAGNOSIS — J3081 Allergic rhinitis due to animal (cat) (dog) hair and dander: Secondary | ICD-10-CM | POA: Diagnosis not present

## 2023-12-25 DIAGNOSIS — J301 Allergic rhinitis due to pollen: Secondary | ICD-10-CM | POA: Diagnosis not present

## 2023-12-25 DIAGNOSIS — J3089 Other allergic rhinitis: Secondary | ICD-10-CM | POA: Diagnosis not present

## 2024-01-04 DIAGNOSIS — J3089 Other allergic rhinitis: Secondary | ICD-10-CM | POA: Diagnosis not present

## 2024-01-04 DIAGNOSIS — J3081 Allergic rhinitis due to animal (cat) (dog) hair and dander: Secondary | ICD-10-CM | POA: Diagnosis not present

## 2024-01-04 DIAGNOSIS — J301 Allergic rhinitis due to pollen: Secondary | ICD-10-CM | POA: Diagnosis not present

## 2024-01-10 DIAGNOSIS — J3081 Allergic rhinitis due to animal (cat) (dog) hair and dander: Secondary | ICD-10-CM | POA: Diagnosis not present

## 2024-01-10 DIAGNOSIS — J301 Allergic rhinitis due to pollen: Secondary | ICD-10-CM | POA: Diagnosis not present

## 2024-01-10 DIAGNOSIS — J3089 Other allergic rhinitis: Secondary | ICD-10-CM | POA: Diagnosis not present

## 2024-01-16 ENCOUNTER — Other Ambulatory Visit: Payer: Self-pay

## 2024-01-16 DIAGNOSIS — L2089 Other atopic dermatitis: Secondary | ICD-10-CM

## 2024-01-16 MED ORDER — DUPIXENT 300 MG/2ML ~~LOC~~ SOSY: 300.0000 mg | PREFILLED_SYRINGE | SUBCUTANEOUS | 0 refills | Status: DC

## 2024-01-16 NOTE — Progress Notes (Signed)
 1 RF for the month of November. aw

## 2024-01-17 DIAGNOSIS — J3081 Allergic rhinitis due to animal (cat) (dog) hair and dander: Secondary | ICD-10-CM | POA: Diagnosis not present

## 2024-01-17 DIAGNOSIS — J3089 Other allergic rhinitis: Secondary | ICD-10-CM | POA: Diagnosis not present

## 2024-01-17 DIAGNOSIS — J301 Allergic rhinitis due to pollen: Secondary | ICD-10-CM | POA: Diagnosis not present

## 2024-01-22 ENCOUNTER — Encounter: Payer: Self-pay | Admitting: Dermatology

## 2024-01-22 ENCOUNTER — Ambulatory Visit (INDEPENDENT_AMBULATORY_CARE_PROVIDER_SITE_OTHER): Admitting: Dermatology

## 2024-01-22 DIAGNOSIS — L2089 Other atopic dermatitis: Secondary | ICD-10-CM

## 2024-01-22 DIAGNOSIS — Z79899 Other long term (current) drug therapy: Secondary | ICD-10-CM

## 2024-01-22 MED ORDER — DUPIXENT 300 MG/2ML ~~LOC~~ SOSY: 300.0000 mg | PREFILLED_SYRINGE | SUBCUTANEOUS | 5 refills | Status: AC

## 2024-01-22 NOTE — Patient Instructions (Signed)
 Continue Dupixent  300 mg injection every 2 weeks.     Gentle Skin Care Guide  1. Bathe no more than once a day.  2. Avoid bathing in hot water  3. Use a mild soap like Dove, Vanicream, Cetaphil, CeraVe. Can use Lever 2000 or Cetaphil antibacterial soap  4. Use soap only where you need it. On most days, use it under your arms, between your legs, and on your feet. Let the water rinse other areas unless visibly dirty.  5. When you get out of the bath/shower, use a towel to gently blot your skin dry, don't rub it.  6. While your skin is still a little damp, apply a moisturizing cream such as Vanicream, CeraVe, Cetaphil, Eucerin, Sarna lotion or plain Vaseline Jelly. For hands apply Neutrogena Norwegian Hand Cream or Excipial Hand Cream.  7. Reapply moisturizer any time you start to itch or feel dry.  8. Sometimes using free and clear laundry detergents can be helpful. Fabric softener sheets should be avoided. Downy Free & Gentle liquid, or any liquid fabric softener that is free of dyes and perfumes, it acceptable to use  9. If your doctor has given you prescription creams you may apply moisturizers over them       Due to recent changes in healthcare laws, you may see results of your pathology and/or laboratory studies on MyChart before the doctors have had a chance to review them. We understand that in some cases there may be results that are confusing or concerning to you. Please understand that not all results are received at the same time and often the doctors may need to interpret multiple results in order to provide you with the best plan of care or course of treatment. Therefore, we ask that you please give us  2 business days to thoroughly review all your results before contacting the office for clarification. Should we see a critical lab result, you will be contacted sooner.   If You Need Anything After Your Visit  If you have any questions or concerns for your doctor, please  call our main line at [phone] and press option 4 to reach your doctor's medical assistant. If no one answers, please leave a voicemail as directed and we will return your call as soon as possible. Messages left after 4 pm will be answered the following business day.   You may also send us  a message via MyChart. We typically respond to MyChart messages within 1-2 business days.  For prescription refills, please ask your pharmacy to contact our office. Our fax number is [phone].  If you have an urgent issue when the clinic is closed that cannot wait until the next business day, you can page your doctor at the number below.    Please note that while we do our best to be available for urgent issues outside of office hours, we are not available 24/7.   If you have an urgent issue and are unable to reach us , you may choose to seek medical care at your doctor's office, retail clinic, urgent care center, or emergency room.  If you have a medical emergency, please immediately call 911 or go to the emergency department.  Pager Numbers  - Dr. Hester: [phone]  - Dr. Jackquline: [phone]  - Dr. Claudene: [phone]   - Dr. Raymund: [phone]  In the event of inclement weather, please call our main line at [phone] for an update on the status of any delays or closures.  Dermatology Medication Tips:  Please keep the boxes that topical medications come in in order to help keep track of the instructions about where and how to use these. Pharmacies typically print the medication instructions only on the boxes and not directly on the medication tubes.   If your medication is too expensive, please contact our office at [phone] option 4 or send us  a message through MyChart.   We are unable to tell what your co-pay for medications will be in advance as this is different depending on your insurance coverage. However, we may be able to find a substitute medication at lower cost or  fill out paperwork to get insurance to cover a needed medication.   If a prior authorization is required to get your medication covered by your insurance company, please allow us  1-2 business days to complete this process.  Drug prices often vary depending on where the prescription is filled and some pharmacies may offer cheaper prices.  The website www.goodrx.com contains coupons for medications through different pharmacies. The prices here do not account for what the cost may be with help from insurance (it may be cheaper with your insurance), but the website can give you the price if you did not use any insurance.  - You can print the associated coupon and take it with your prescription to the pharmacy.  - You may also stop by our office during regular business hours and pick up a GoodRx coupon card.  - If you need your prescription sent electronically to a different pharmacy, notify our office through [facility] or by phone at [phone] option 4.     Si Usted Necesita Algo Despus de Su Visita  Tambin puede enviarnos un mensaje a travs de Clinical Cytogeneticist. Por lo general respondemos a los mensajes de MyChart en el transcurso de 1 a 2 das hbiles.  Para renovar recetas, por favor pida a su farmacia que se ponga en contacto con nuestra oficina. Randi lakes de fax es [place] [phone].  Si tiene un asunto urgente cuando la clnica est cerrada y que no puede esperar hasta el siguiente da hbil, puede llamar/localizar a su doctor(a) al nmero que aparece a continuacin.   Por favor, tenga en cuenta que aunque hacemos todo lo posible para estar disponibles para asuntos urgentes fuera del horario de [place], no estamos disponibles las 24 horas del da, los 7 [address]  [address] de la [place].   Si tiene un problema urgente y no puede comunicarse con nosotros, puede optar por buscar atencin mdica  en el consultorio de su doctor(a), en una clnica privada, en un centro de atencin urgente o en una sala  de emergencias.  Si tiene engineer, drilling, por favor llame inmediatamente al 911 o vaya a la sala de emergencias.  Nmeros de bper  - Dr. Hester: [phone]  - Dra. Jackquline: [phone]  - Dr. Claudene: [phone]  - Dra. Kitts: [phone]  En caso de inclemencias del [place], por favor llame a nuestra lnea principal al [phone] para una actualizacin sobre el [place] de cualquier retraso o cierre.  Consejos para la medicacin en dermatologa: Por favor, guarde las cajas en las que vienen los medicamentos de uso tpico para ayudarle a seguir las instrucciones sobre dnde y cmo usarlos. Las farmacias generalmente imprimen las instrucciones del medicamento slo en las cajas y no directamente en los tubos del [place].   Si su medicamento es muy caro, por favor, pngase en contacto con landry rieger llamando al [phone] y presione la opcin 4 o  envenos un mensaje a travs de MyChart.   No podemos decirle cul ser su copago por los medicamentos por adelantado ya que esto es diferente dependiendo de la cobertura de su seguro. Sin embargo, es posible que podamos encontrar un medicamento sustituto a audiological scientist un formulario para que el seguro cubra el medicamento que se considera necesario.   Si se requiere una autorizacin previa para que su compaa de seguros cubra su medicamento, por favor permtanos de 1 a 2 das hbiles para completar este proceso.  Los precios de los medicamentos varan con frecuencia dependiendo del environmental consultant de dnde se surte la receta y alguna farmacias pueden ofrecer precios ms baratos.  El sitio web www.goodrx.com tiene cupones para medicamentos de health and safety inspector. Los precios aqu no tienen en cuenta lo que podra costar con la ayuda del seguro (puede ser ms barato con su seguro), pero el sitio web puede darle el precio si no utiliz tourist information centre manager.  - Puede imprimir el cupn correspondiente y llevarlo con su receta a la  farmacia.  - Tambin puede pasar por nuestra oficina durante el horario de atencin regular y education officer, museum una tarjeta de cupones de GoodRx.  - Si necesita que su receta se enve electrnicamente a una farmacia diferente, informe a nuestra oficina a travs de MyChart de [place] o por telfono llamando al [phone] y presione la opcin 4.

## 2024-01-22 NOTE — Progress Notes (Signed)
   Follow-Up Visit   Subjective  Kristine Campbell is a 43 y.o. female who presents for the following: Atopic Dermatitis. 6 month follow up. On Dupixent . Tolerating well. Denies side effects, adverse reactions or injection site reactions. Itching has improved. Using Mometasone  cream and Tacrolimus  ointment as needed, does not use very often since not itching.     The following portions of the chart were reviewed this encounter and updated as appropriate: medications, allergies, medical history  Review of Systems:  No other skin or systemic complaints except as noted in HPI or Assessment and Plan.  Objective  Well appearing patient in no apparent distress; mood and affect are within normal limits.  Areas Examined: Arms, torso  Relevant physical exam findings are noted in the Assessment and Plan.    Assessment & Plan   OTHER ATOPIC DERMATITIS   Related Medications mometasone  (ELOCON ) 0.1 % cream Apply to affected areas rash once to twice daily until improved. dupilumab  (DUPIXENT ) 300 MG/2ML prefilled syringe Inject 300 mg into the skin every 14 (fourteen) days. Starting at day 15 for maintenance.   ATOPIC DERMATITIS Exam: pink scaly papule at left elbow- itchy per pt, hyperpigmented macules at BL elbow area, xerosis at back 1% BSA  Chronic condition with duration or expected duration over one year. Currently well-controlled.    Atopic dermatitis - Severe, on Dupixent  (biologic medication).  Atopic dermatitis (eczema) is a chronic, relapsing, pruritic condition that can significantly affect quality of life. It is often associated with allergic rhinitis and/or asthma and can require treatment with topical medications, phototherapy, or in severe cases biologic medications, which require long term medication management.    Treatment Plan:  Continue tacrolimus  ointment once to twice daily to aa's face/body. Continue Mometasone  0.1% cream to aa's QD-BID PRN.  Topical steroids  (such as triamcinolone, fluocinolone, fluocinonide, mometasone , clobetasol, halobetasol, betamethasone, hydrocortisone) can cause thinning and lightening of the skin if they are used for too long in the same area. Your physician has selected the right strength medicine for your problem and area affected on the body. Please use your medication only as directed by your physician to prevent side effects.    Continue Dupixent  300 mg/2mL SQ QOW. Patient denies side effects.    Potential side effects include allergic reaction, herpes infections, injection site reactions and conjunctivitis (inflammation of the eyes).  The use of Dupixent  requires long term medication management, including periodic office visits.    Topical steroids (such as triamcinolone, fluocinolone, fluocinonide, mometasone , clobetasol, halobetasol, betamethasone, hydrocortisone) can cause thinning and lightening of the skin if they are used for too long in the same area. Your physician has selected the right strength medicine for your problem and area affected on the body. Please use your medication only as directed by your physician to prevent side effects.    Long term medication management.  Patient is using long term (months to years) prescription medication  to control their dermatologic condition.  These medications require periodic monitoring to evaluate for efficacy and side effects and may require periodic laboratory monitoring.   Recommend gentle skin care.   Return in about 6 months (around 07/21/2024) for Atopic Dermatitis Follow Up, Dupixent  Follow Up.  I, Kate Fought, CMA, am acting as scribe for Rexene Rattler, MD.   Documentation: I have reviewed the above documentation for accuracy and completeness, and I agree with the above.  Rexene Rattler, MD

## 2024-01-24 DIAGNOSIS — J3081 Allergic rhinitis due to animal (cat) (dog) hair and dander: Secondary | ICD-10-CM | POA: Diagnosis not present

## 2024-01-24 DIAGNOSIS — J301 Allergic rhinitis due to pollen: Secondary | ICD-10-CM | POA: Diagnosis not present

## 2024-01-24 DIAGNOSIS — J3089 Other allergic rhinitis: Secondary | ICD-10-CM | POA: Diagnosis not present

## 2024-07-22 ENCOUNTER — Ambulatory Visit: Admitting: Dermatology
